# Patient Record
Sex: Female | Born: 1995 | Race: White | Hispanic: No | Marital: Married | Smoking: Never smoker
Health system: Southern US, Community
[De-identification: ages and names within clinical notes are randomized; demographics above are authoritative.]

## PROBLEM LIST (undated history)

## (undated) DIAGNOSIS — Z789 Other specified health status: Secondary | ICD-10-CM

## (undated) DIAGNOSIS — N390 Urinary tract infection, site not specified: Secondary | ICD-10-CM

## (undated) HISTORY — PX: NO PAST SURGERIES: SHX2092

## (undated) HISTORY — DX: Other specified health status: Z78.9

---

## 2019-10-11 NOTE — L&D Delivery Note (Signed)
DELIVERY NOTE  Pt complete and at +2 station with urge to push. Epidural controlling pain. During pushing, deep variable decelerations noted that begant o have slower return to baseline. At this time, station +3. Patient felt to have good chance for vaginal delivery and was consented for operative vaginal delivery via vacuum extractor.  Risks and benefits discussed in detail.  Risks include, but are not limited to the risks of anesthesia, bleeding, infection, damage to maternal tissues, fetal cephalhematoma.  There is also the risk of inability to effect vaginal delivery of the head, or shoulder dystocia that cannot be resolved by established maneuvers, leading to the need for emergency cesarean section.   Midline episiotomy cut. Mity Vac Bell cup applied once, 3 pulls in total. Pt pushed and delivered a viable female infant in LOA position. Anterior and posterior shoulders spontaneously delivered with next two pushes; body easily followed next. Infant placed on mothers abdomen and bulb suction of mouth and nose performed. Cord was then clamped and cut by MD. Cord blood obtained, 3VC. Shorter umbilical cord noted after placenta delivery. Baby had a vigorous spontaneous cry noted. Placenta then delivered at 1517 intact. Fundal massage performed and pitocin per protocol. Fundus firm. The following lacerations were noted: midline episiotomy/shallow 2nd degree. Repaired in routine fashion with 2-0 vicryl Mother and baby stable. Counts correct   Infant time: 1514 Gender: female Placenta time: 1517 Apgars: 9/9 Weight: pending skin-to-skin  Desires circ

## 2019-10-24 LAB — OB RESULTS CONSOLE GC/CHLAMYDIA
Chlamydia: NEGATIVE
Gonorrhea: NEGATIVE

## 2019-10-24 LAB — OB RESULTS CONSOLE HIV ANTIBODY (ROUTINE TESTING): HIV: NONREACTIVE

## 2019-10-24 LAB — OB RESULTS CONSOLE ABO/RH: RH Type: POSITIVE

## 2019-10-24 LAB — OB RESULTS CONSOLE RPR: RPR: NONREACTIVE

## 2019-10-24 LAB — OB RESULTS CONSOLE HEPATITIS B SURFACE ANTIGEN: Hepatitis B Surface Ag: NEGATIVE

## 2019-10-24 LAB — OB RESULTS CONSOLE RUBELLA ANTIBODY, IGM: Rubella: IMMUNE

## 2019-10-24 LAB — OB RESULTS CONSOLE ANTIBODY SCREEN: Antibody Screen: NEGATIVE

## 2020-03-31 LAB — OB RESULTS CONSOLE GBS: GBS: NEGATIVE

## 2020-04-15 ENCOUNTER — Encounter (HOSPITAL_COMMUNITY): Payer: Self-pay | Admitting: *Deleted

## 2020-04-15 ENCOUNTER — Telehealth (HOSPITAL_COMMUNITY): Payer: Self-pay | Admitting: *Deleted

## 2020-04-15 NOTE — Telephone Encounter (Signed)
Preadmission screen  

## 2020-04-23 ENCOUNTER — Other Ambulatory Visit (HOSPITAL_COMMUNITY)
Admission: RE | Admit: 2020-04-23 | Discharge: 2020-04-23 | Disposition: A | Discharge status: Home / Self Care | Payer: BC Managed Care – PPO | Source: Ambulatory Visit | Attending: Obstetrics and Gynecology | Admitting: Obstetrics and Gynecology

## 2020-04-23 DIAGNOSIS — Z01812 Encounter for preprocedural laboratory examination: Secondary | ICD-10-CM | POA: Insufficient documentation

## 2020-04-23 DIAGNOSIS — Z20822 Contact with and (suspected) exposure to covid-19: Secondary | ICD-10-CM | POA: Insufficient documentation

## 2020-04-23 LAB — SARS CORONAVIRUS 2 (TAT 6-24 HRS): SARS Coronavirus 2: NEGATIVE

## 2020-04-24 ENCOUNTER — Other Ambulatory Visit: Payer: Self-pay | Admitting: Obstetrics and Gynecology

## 2020-04-25 ENCOUNTER — Inpatient Hospital Stay (HOSPITAL_COMMUNITY): Payer: BC Managed Care – PPO | Admitting: Anesthesiology

## 2020-04-25 ENCOUNTER — Inpatient Hospital Stay (HOSPITAL_COMMUNITY)
Admission: AD | Admit: 2020-04-25 | Discharge: 2020-04-27 | DRG: 807 | Disposition: A | Discharge status: Home / Self Care | Payer: BC Managed Care – PPO | Attending: Obstetrics and Gynecology | Admitting: Obstetrics and Gynecology

## 2020-04-25 ENCOUNTER — Other Ambulatory Visit: Payer: Self-pay

## 2020-04-25 ENCOUNTER — Inpatient Hospital Stay (HOSPITAL_COMMUNITY): Payer: BC Managed Care – PPO

## 2020-04-25 ENCOUNTER — Encounter (HOSPITAL_COMMUNITY): Payer: Self-pay | Admitting: Obstetrics and Gynecology

## 2020-04-25 DIAGNOSIS — Z3A39 39 weeks gestation of pregnancy: Secondary | ICD-10-CM | POA: Diagnosis not present

## 2020-04-25 DIAGNOSIS — Z20822 Contact with and (suspected) exposure to covid-19: Secondary | ICD-10-CM | POA: Diagnosis present

## 2020-04-25 DIAGNOSIS — O26893 Other specified pregnancy related conditions, third trimester: Secondary | ICD-10-CM | POA: Diagnosis present

## 2020-04-25 DIAGNOSIS — G2581 Restless legs syndrome: Secondary | ICD-10-CM | POA: Diagnosis present

## 2020-04-25 DIAGNOSIS — O99892 Other specified diseases and conditions complicating childbirth: Secondary | ICD-10-CM | POA: Diagnosis present

## 2020-04-25 LAB — CBC
HCT: 39.5 % (ref 36.0–46.0)
Hemoglobin: 13.2 g/dL (ref 12.0–15.0)
MCH: 29.9 pg (ref 26.0–34.0)
MCHC: 33.4 g/dL (ref 30.0–36.0)
MCV: 89.6 fL (ref 80.0–100.0)
Platelets: 176 10*3/uL (ref 150–400)
RBC: 4.41 MIL/uL (ref 3.87–5.11)
RDW: 13.5 % (ref 11.5–15.5)
WBC: 10 10*3/uL (ref 4.0–10.5)
nRBC: 0 % (ref 0.0–0.2)

## 2020-04-25 LAB — TYPE AND SCREEN
ABO/RH(D): B POS
Antibody Screen: NEGATIVE

## 2020-04-25 LAB — RPR: RPR Ser Ql: NONREACTIVE

## 2020-04-25 LAB — ABO/RH: ABO/RH(D): B POS

## 2020-04-25 MED ORDER — LACTATED RINGERS IV SOLN: 500.0000 mL | Freq: Once | INTRAVENOUS | Status: DC

## 2020-04-25 MED ORDER — DIBUCAINE (PERIANAL) 1 % EX OINT: 1.0000 "application " | TOPICAL_OINTMENT | CUTANEOUS | Status: DC | PRN

## 2020-04-25 MED ORDER — COCONUT OIL OIL
1.0000 "application " | TOPICAL_OIL | Status: DC | PRN
  Administered 2020-04-27: 1 via TOPICAL

## 2020-04-25 MED ORDER — EPHEDRINE 5 MG/ML INJ: 10.0000 mg | INTRAVENOUS | Status: DC | PRN

## 2020-04-25 MED ORDER — WITCH HAZEL-GLYCERIN EX PADS: 1.0000 "application " | MEDICATED_PAD | CUTANEOUS | Status: DC | PRN

## 2020-04-25 MED ORDER — OXYTOCIN BOLUS FROM INFUSION
333.0000 mL | Freq: Once | INTRAVENOUS | Status: AC
  Administered 2020-04-25: 333 mL via INTRAVENOUS

## 2020-04-25 MED ORDER — DIPHENHYDRAMINE HCL 25 MG PO CAPS: 25.0000 mg | ORAL_CAPSULE | Freq: Four times a day (QID) | ORAL | Status: DC | PRN

## 2020-04-25 MED ORDER — LACTATED RINGERS IV SOLN
500.0000 mL | Freq: Once | INTRAVENOUS | Status: AC
  Administered 2020-04-25: 500 mL via INTRAVENOUS

## 2020-04-25 MED ORDER — ONDANSETRON HCL 4 MG/2ML IJ SOLN: 4.0000 mg | INTRAMUSCULAR | Status: DC | PRN

## 2020-04-25 MED ORDER — TERBUTALINE SULFATE 1 MG/ML IJ SOLN: 0.2500 mg | Freq: Once | INTRAMUSCULAR | Status: DC | PRN

## 2020-04-25 MED ORDER — LACTATED RINGERS IV SOLN: 500.0000 mL | INTRAVENOUS | Status: DC | PRN

## 2020-04-25 MED ORDER — OXYCODONE-ACETAMINOPHEN 5-325 MG PO TABS: 1.0000 | ORAL_TABLET | ORAL | Status: DC | PRN

## 2020-04-25 MED ORDER — BENZOCAINE-MENTHOL 20-0.5 % EX AERO
1.0000 "application " | INHALATION_SPRAY | CUTANEOUS | Status: DC | PRN
  Administered 2020-04-25: 1 via TOPICAL
  Filled 2020-04-25: qty 56

## 2020-04-25 MED ORDER — FLEET ENEMA 7-19 GM/118ML RE ENEM: 1.0000 | ENEMA | RECTAL | Status: DC | PRN

## 2020-04-25 MED ORDER — OXYCODONE-ACETAMINOPHEN 5-325 MG PO TABS: 2.0000 | ORAL_TABLET | ORAL | Status: DC | PRN

## 2020-04-25 MED ORDER — IBUPROFEN 600 MG PO TABS
600.0000 mg | ORAL_TABLET | Freq: Four times a day (QID) | ORAL | Status: DC
  Administered 2020-04-25 – 2020-04-27 (×8): 600 mg via ORAL
  Filled 2020-04-25 (×8): qty 1

## 2020-04-25 MED ORDER — ONDANSETRON HCL 4 MG PO TABS: 4.0000 mg | ORAL_TABLET | ORAL | Status: DC | PRN

## 2020-04-25 MED ORDER — PHENYLEPHRINE 40 MCG/ML (10ML) SYRINGE FOR IV PUSH (FOR BLOOD PRESSURE SUPPORT)
80.0000 ug | PREFILLED_SYRINGE | INTRAVENOUS | Status: DC | PRN
  Administered 2020-04-25 (×2): 80 ug via INTRAVENOUS

## 2020-04-25 MED ORDER — SIMETHICONE 80 MG PO CHEW: 80.0000 mg | CHEWABLE_TABLET | ORAL | Status: DC | PRN

## 2020-04-25 MED ORDER — OXYTOCIN-SODIUM CHLORIDE 30-0.9 UT/500ML-% IV SOLN: 2.5000 [IU]/h | INTRAVENOUS | Status: DC

## 2020-04-25 MED ORDER — BUTORPHANOL TARTRATE 1 MG/ML IJ SOLN
1.0000 mg | INTRAMUSCULAR | Status: DC | PRN
  Administered 2020-04-25: 1 mg via INTRAVENOUS
  Filled 2020-04-25 (×2): qty 1

## 2020-04-25 MED ORDER — LIDOCAINE HCL (PF) 1 % IJ SOLN
INTRAMUSCULAR | Status: DC | PRN
  Administered 2020-04-25 (×2): 4 mL via EPIDURAL

## 2020-04-25 MED ORDER — SOD CITRATE-CITRIC ACID 500-334 MG/5ML PO SOLN: 30.0000 mL | ORAL | Status: DC | PRN

## 2020-04-25 MED ORDER — TETANUS-DIPHTH-ACELL PERTUSSIS 5-2.5-18.5 LF-MCG/0.5 IM SUSP: 0.5000 mL | Freq: Once | INTRAMUSCULAR | Status: DC

## 2020-04-25 MED ORDER — FENTANYL-BUPIVACAINE-NACL 0.5-0.125-0.9 MG/250ML-% EP SOLN
12.0000 mL/h | EPIDURAL | Status: DC | PRN
  Filled 2020-04-25: qty 250

## 2020-04-25 MED ORDER — OXYTOCIN-SODIUM CHLORIDE 30-0.9 UT/500ML-% IV SOLN
1.0000 m[IU]/min | INTRAVENOUS | Status: DC
  Administered 2020-04-25: 2 m[IU]/min via INTRAVENOUS
  Filled 2020-04-25: qty 500

## 2020-04-25 MED ORDER — LACTATED RINGERS IV SOLN: INTRAVENOUS | Status: DC

## 2020-04-25 MED ORDER — ACETAMINOPHEN 325 MG PO TABS: 650.0000 mg | ORAL_TABLET | ORAL | Status: DC | PRN

## 2020-04-25 MED ORDER — LIDOCAINE HCL (PF) 1 % IJ SOLN: 30.0000 mL | INTRAMUSCULAR | Status: DC | PRN

## 2020-04-25 MED ORDER — SODIUM CHLORIDE (PF) 0.9 % IJ SOLN
INTRAMUSCULAR | Status: DC | PRN
  Administered 2020-04-25: 12 mL/h via EPIDURAL

## 2020-04-25 MED ORDER — DIPHENHYDRAMINE HCL 50 MG/ML IJ SOLN: 12.5000 mg | INTRAMUSCULAR | Status: DC | PRN

## 2020-04-25 MED ORDER — PRENATAL MULTIVITAMIN CH
1.0000 | ORAL_TABLET | Freq: Every day | ORAL | Status: DC
  Administered 2020-04-26 – 2020-04-27 (×2): 1 via ORAL
  Filled 2020-04-25 (×2): qty 1

## 2020-04-25 MED ORDER — SENNOSIDES-DOCUSATE SODIUM 8.6-50 MG PO TABS
2.0000 | ORAL_TABLET | ORAL | Status: DC
  Administered 2020-04-25 – 2020-04-26 (×2): 2 via ORAL
  Filled 2020-04-25 (×2): qty 2

## 2020-04-25 MED ORDER — ZOLPIDEM TARTRATE 5 MG PO TABS: 5.0000 mg | ORAL_TABLET | Freq: Every evening | ORAL | Status: DC | PRN

## 2020-04-25 MED ORDER — ONDANSETRON HCL 4 MG/2ML IJ SOLN: 4.0000 mg | Freq: Four times a day (QID) | INTRAMUSCULAR | Status: DC | PRN

## 2020-04-25 MED ORDER — PHENYLEPHRINE 40 MCG/ML (10ML) SYRINGE FOR IV PUSH (FOR BLOOD PRESSURE SUPPORT)
80.0000 ug | PREFILLED_SYRINGE | INTRAVENOUS | Status: DC | PRN
  Filled 2020-04-25: qty 10

## 2020-04-25 NOTE — Anesthesia Procedure Notes (Signed)
Epidural Patient location during procedure: OB Start time: 04/25/2020 2:12 PM End time: 04/25/2020 2:19 PM  Staffing Anesthesiologist: Mal Amabile, MD Performed: anesthesiologist   Preanesthetic Checklist Completed: patient identified, IV checked, site marked, risks and benefits discussed, surgical consent, monitors and equipment checked, pre-op evaluation and timeout performed  Epidural Patient position: sitting Prep: DuraPrep and site prepped and draped Patient monitoring: continuous pulse ox and blood pressure Approach: midline Location: L3-L4 Injection technique: LOR air  Needle:  Needle type: Tuohy  Needle gauge: 17 G Needle length: 9 cm and 9 Needle insertion depth: 5 cm cm Catheter type: closed end flexible Catheter size: 19 Gauge Catheter at skin depth: 10 cm Test dose: negative and Other  Assessment Events: blood not aspirated, injection not painful, no injection resistance, no paresthesia and negative IV test  Additional Notes Patient identified. Risks and benefits discussed including failed block, incomplete  Pain control, post dural puncture headache, nerve damage, paralysis, blood pressure Changes, nausea, vomiting, reactions to medications-both toxic and allergic and post Partum back pain. All questions were answered. Patient expressed understanding and wished to proceed. Sterile technique was used throughout procedure. Epidural site was Dressed with sterile barrier dressing. No paresthesias, signs of intravascular injection Or signs of intrathecal spread were encountered.  Patient was more comfortable after the epidural was dosed. Please see RN's note for documentation of vital signs and FHR which are stable. Reason for block:procedure for pain

## 2020-04-25 NOTE — Progress Notes (Signed)
Pt s/p epidural, still with some lingering pain on left side. CE now complete/0, no urge to push yet. Will allow epidural to engage fully then being active mgmt

## 2020-04-25 NOTE — H&P (Signed)
Beverly Dean is a 24 y.o. female presenting for scheduled induciton. +FM, denies VB, LOF, CTX.  PNC c/b restless legs syndrome. OB History    Gravida  1   Para      Term      Preterm      AB      Living        SAB      TAB      Ectopic      Multiple      Live Births             Past Medical History:  Diagnosis Date  . Medical history non-contributory    Past Surgical History:  Procedure Laterality Date  . NO PAST SURGERIES     Family History: family history is not on file. Social History:  reports that she has never smoked. She has never used smokeless tobacco. She reports previous alcohol use. She reports that she does not use drugs.     Maternal Diabetes: No 1hr 68 Genetic Screening: Declined Maternal Ultrasounds/Referrals: Normal Fetal Ultrasounds or other Referrals:  None Maternal Substance Abuse:  No Significant Maternal Medications:  None Significant Maternal Lab Results:  Group B Strep negative Other Comments:  None  Review of Systems  Constitutional: Negative for chills and fever.  Respiratory: Negative for shortness of breath.   Cardiovascular: Negative for chest pain, palpitations and leg swelling.  Gastrointestinal: Negative for abdominal pain and vomiting.  Neurological: Negative for dizziness, weakness and headaches.  Psychiatric/Behavioral: Negative for suicidal ideas.   History   There were no vitals taken for this visit. Exam Physical Exam Constitutional:      General: She is not in acute distress.    Appearance: She is well-developed.  HENT:     Head: Normocephalic and atraumatic.  Eyes:     Pupils: Pupils are equal, round, and reactive to light.  Cardiovascular:     Rate and Rhythm: Normal rate and regular rhythm.     Heart sounds: No murmur heard.  No gallop.   Abdominal:     Tenderness: There is no abdominal tenderness. There is no guarding or rebound.  Genitourinary:    Vagina: Normal.  Musculoskeletal:         General: Normal range of motion.     Cervical back: Normal range of motion and neck supple.  Skin:    General: Skin is warm and dry.  Neurological:     Mental Status: She is alert and oriented to person, place, and time.     Prenatal labs: ABO, Rh: B/Positive/-- (01/14 0000) Antibody: Negative (01/14 0000) Rubella: Immune (01/14 0000) RPR: Nonreactive (01/14 0000)  HBsAg: Negative (01/14 0000)  HIV: Non-reactive (01/14 0000)  GBS: Negative/-- (06/22 0000)   Assessment/Plan: This is a 23yo G1P0 @ 39 5/7 by LMP c/w 13 3/7 scan admitted for IOL for favorable cervix at term. PNC c/b restless legs. GBS neg, baby boy, desire circ. AROM, pitocin per protocol, anticipate SVD   Beverly Dean 04/25/2020, 8:40 AM

## 2020-04-25 NOTE — Progress Notes (Signed)
Labor Note  S: s/p stadol x1, feeling increased pressure w/ contraction  O: BP (!) 103/57   Pulse 78   Temp 98 F (36.7 C) (Oral)   Resp 18   Ht 5' 4.5" (1.638 m)   Wt 71.5 kg   BMI 26.65 kg/m  CE: 4/90/0 FHR: Baseline 130, +accels, + early decels, min to modvariability TOCO 4, pitocin at 24mU/min  A/P: This is a 24 y.o. G1P0 at [redacted]w[redacted]d  admitted for IOL. GBS neg, female FWB: cat 1 MWB: s/p stadol x1, deciding on epidural or no Labor course: latent labor but significant descent since admission. Continue to titrate per protocol  Anticipate SVD

## 2020-04-25 NOTE — Progress Notes (Signed)
Cat 1 tracing with baseline 145, + accels, no decels, mod var. Irr ctx on TOCO Clear AROM 0900, CE 3/50/-2. Initiate and titrate pitocin per protocol. VTX on Le[place], EFW 3200g

## 2020-04-25 NOTE — Anesthesia Preprocedure Evaluation (Signed)
Anesthesia Evaluation  Patient identified by MRN, date of birth, ID band Patient awake    Reviewed: Allergy & Precautions, Patient's Chart, lab work & pertinent test results  Airway Mallampati: II  TM Distance: >3 FB Neck ROM: Full    Dental no notable dental hx. (+) Teeth Intact   Pulmonary neg pulmonary ROS,    Pulmonary exam normal breath sounds clear to auscultation       Cardiovascular negative cardio ROS Normal cardiovascular exam Rhythm:Regular Rate:Normal     Neuro/Psych negative neurological ROS  negative psych ROS   GI/Hepatic Neg liver ROS, GERD  ,  Endo/Other  negative endocrine ROS  Renal/GU negative Renal ROS  negative genitourinary   Musculoskeletal negative musculoskeletal ROS (+)   Abdominal   Peds  Hematology negative hematology ROS (+)   Anesthesia Other Findings   Reproductive/Obstetrics (+) Pregnancy                             Anesthesia Physical  Anesthesia Plan  ASA: II  Anesthesia Plan: Epidural   Post-op Pain Management:    Induction:   PONV Risk Score and Plan:   Airway Management Planned: Natural Airway  Additional Equipment:   Intra-op Plan:   Post-operative Plan:   Informed Consent: I have reviewed the patients History and Physical, chart, labs and discussed the procedure including the risks, benefits and alternatives for the proposed anesthesia with the patient or authorized representative who has indicated his/her understanding and acceptance.       Plan Discussed with: Anesthesiologist  Anesthesia Plan Comments:         Anesthesia Quick Evaluation  

## 2020-04-26 LAB — CBC
HCT: 32.8 % — ABNORMAL LOW (ref 36.0–46.0)
Hemoglobin: 10.9 g/dL — ABNORMAL LOW (ref 12.0–15.0)
MCH: 30.4 pg (ref 26.0–34.0)
MCHC: 33.2 g/dL (ref 30.0–36.0)
MCV: 91.6 fL (ref 80.0–100.0)
Platelets: 137 10*3/uL — ABNORMAL LOW (ref 150–400)
RBC: 3.58 MIL/uL — ABNORMAL LOW (ref 3.87–5.11)
RDW: 13.5 % (ref 11.5–15.5)
WBC: 14.4 10*3/uL — ABNORMAL HIGH (ref 4.0–10.5)
nRBC: 0 % (ref 0.0–0.2)

## 2020-04-26 NOTE — Anesthesia Postprocedure Evaluation (Signed)
Anesthesia Post Note  Patient: Beverly Dean  Procedure(s) Performed: AN AD HOC LABOR EPIDURAL     Patient location during evaluation: Mother Baby Anesthesia Type: Epidural Level of consciousness: awake Pain management: satisfactory to patient Vital Signs Assessment: post-procedure vital signs reviewed and stable Respiratory status: spontaneous breathing Cardiovascular status: stable Anesthetic complications: no   No complications documented.  Last Vitals:  Vitals:   04/25/20 2211 04/26/20 0300  BP: 99/65 110/67  Pulse: 91 71  Resp: 16 18  Temp: 36.7 C 36.7 C  SpO2: 100% 100%    Last Pain:  Vitals:   04/26/20 0300  TempSrc: Oral  PainSc:    Pain Goal:                   [employer]

## 2020-04-26 NOTE — Lactation Note (Signed)
This note was copied from a baby's chart. Lactation Consultation Note  Patient Name: Boy Madalynn Pickelsimer VFIEP'P Date: 04/26/2020 Reason for consult: Follow-up assessment;Primapara    Infant is 31 hours old 24 weeker with 2%wt loss. Mom exclusively breastfeeding, noted infant sleeping at the breast. She had some pain with the latch and had some tenderness on both nipples.  Mom has her electric pump but has not used it. She has a manual pump that she used a few times to increase nipple size.   Physical assessment: Outer bruise with redness on both the left and right nipple. LC attempted a few times to latch the baby at the breast but infant sleepy and unlatched easily. Second LC, Kim, tried tea cup hold, but noted the same. We were able to teach Mom hand expression which we were able to see released from both breasts.  LC, Kim, introduced a nipple shield size 20. With shield, infant able to sustain a good latch and audible sounds of milk transfer noted on ausculation. Infant nursed on the left for 20 minutes and on the right for about 5 minutes. Mom is comfortable in cross cradle.   Plan : For Mom, to feed according to cues using the nipple shield and breast compression. Mom knows to feed at least 8 to 12 in 24 hour period. Mom given a size 21 flange for her manual pump which she will used 4 to 6 times a day due to the nipple shield to increase breast stimulation for milk supply.   Milk storage, warm line and breastfeeding support reviewed. Engorgement signs and prevention addressed.     Imoni Kohen  Nicholson-Springer 04/26/2020, 3:29 PM

## 2020-04-26 NOTE — Progress Notes (Addendum)
POSTPARTUM PROGRESS NOTE  Post Partum Day #1  Subjective:  No acute events overnight.  Pt denies problems with ambulating, voiding or po intake.  She denies nausea or vomiting.  Pain is well controlled. Lochia Minimal. Would like discharge today if possible, will have to check baby's feeding status before can OK  Objective: Blood pressure 110/67, pulse 71, temperature 98.1 F (36.7 C), temperature source Oral, resp. rate 18, height 5' 4.5" (1.638 m), weight 71.5 kg, SpO2 100 %, unknown if currently breastfeeding.  Physical Exam:  General: alert, cooperative and no distress Lochia:normal flow Chest: CTAB Heart: RRR no m/r/g Abdomen: +BS, soft, nontender Uterine Fundus: firm, 2cm below umbilicus GU: suture intact, healing well, no purulent drainage Extremities: neg edema, neg calf TTP BL, neg Homans BL  Recent Labs    04/25/20 0848 04/26/20 0455  HGB 13.2 10.9*  HCT 39.5 32.8*    Assessment/Plan:  ASSESSMENT: Lucy Boardman is a 24 y.o. G1P1001 s/p VAVD (FHR decels) @ [redacted]w[redacted]d. PNC c/b restless legs.   Discharge home, Breastfeeding, Lactation consult and Circumcision prior to discharge   LOS: 1 day

## 2020-04-26 NOTE — Lactation Note (Signed)
This note was copied from a baby's chart. Lactation Consultation Note  Patient Name: Beverly Dean BHALP'F Date: 04/26/2020 Reason for consult: Initial assessment;1st time breastfeeding P1, 10 hour term female infant. Per mom, she had DEBP at home. LC changed a void diaper while in the  room, infant had 3 void diapers  since birth. Per mom, infant did not BF in L&D, infant has been latching  in room, mom is working on infant's latch. LC noticed mom is semi short shaft and flat, mom was given a hand pump to pre-pump prior to latching infant at breast. Mom  pre-pumped her breast, then attempt to latch infant on her right breast using the cross cradle hold, infant was reluctant to latch only held breast in mouth at this time. LC discussed hand expression and mom taught back, infant was given 4 mls of colostrum by spoon. Mom will continue to do STS and will continue to latch infant according to cues, 8 to 12+ times within 24 hours and on demand. Mom knows to call RN or LC if she needs further assistance with latching infant at breast. Reviewed Baby & Me book's Breastfeeding Basics.  Mom made aware of O/P services, breastfeeding support groups, community resources, and our phone # for post-discharge questions.      Maternal Data Formula Feeding for Exclusion: No Has patient been taught Hand Expression?: Yes Does the patient have breastfeeding experience prior to this delivery?: No  Feeding Feeding Type: Breast Fed  LATCH Score Latch: Too sleepy or reluctant, no latch achieved, no sucking elicited.  Audible Swallowing: None  Type of Nipple: Flat  Comfort (Breast/Nipple): Soft / non-tender  Hold (Positioning): Assistance needed to correctly position infant at breast and maintain latch.  LATCH Score: 4  Interventions Interventions: Hand express;Breast massage;Support pillows;Position options;Adjust position;Skin to skin;Breast compression;Assisted with latch;Breast feeding basics  reviewed;Pre-pump if needed;Hand pump  Lactation Tools Discussed/Used WIC Program: No Pump Review: Setup, frequency, and cleaning;Milk Storage Initiated by:: Danelle Earthly, IBCLC Date initiated:: 04/26/20   Consult Status Consult Status: Follow-up Date: 04/26/20 Follow-up type: In-patient    Danelle Earthly 04/26/2020, 1:18 AM

## 2020-04-26 NOTE — Progress Notes (Signed)
Baby with latching issues, will postpone circumcision and discharge until tomorrow AM

## 2020-04-27 MED ORDER — IBUPROFEN 600 MG PO TABS: 600.0000 mg | ORAL_TABLET | Freq: Four times a day (QID) | ORAL | 1 refills | Status: DC | PRN

## 2020-04-27 MED ORDER — PRENATAL 27-0.8 MG PO TABS
1.0000 | ORAL_TABLET | Freq: Every day | ORAL | 3 refills | Status: DC
Start: 2020-04-27 — End: 2021-04-12

## 2020-04-27 NOTE — Discharge Summary (Signed)
Postpartum Discharge Summary      Patient Name: Beverly Dean DOB: [birth year] MRN: [id number]  Date of admission: 04/25/2020 Delivery date:04/25/2020  Delivering provider: Deliah Boston  Date of discharge: 04/27/2020  Admitting diagnosis: [redacted] weeks gestation of pregnancy [Z3A.39] Intrauterine pregnancy: [redacted]w[redacted]d    Secondary diagnosis:  Active Problems:   [redacted] weeks gestation of pregnancy  Additional problems: restless legs    Discharge diagnosis: Term Pregnancy Delivered                                              Post partum procedures:N/A Augmentation: AROM and Pitocin Complications: None  Hospital course: Induction of Labor With Vaginal Delivery   24y.o. yo G1P1001 at 317w5das admitted to the hospital 04/25/2020 for induction of labor.  Indication for induction: Favorable cervix at term.  Patient had an uncomplicated labor course as follows: Membrane Rupture Time/Date: 8:58 AM ,04/25/2020   Delivery Method:Vaginal, Vacuum (Extractor)  Episiotomy: Median  Lacerations:  2nd degree  Details of delivery can be found in separate delivery note.  Patient had a routine postpartum course. Patient is discharged home 04/27/20.  Newborn Data: Birth date:04/25/2020  Birth time:3:14 PM  Gender:Female  Living status:Living  Apgars:9 ,9  Weight:3379 g   Magnesium Sulfate received: No BMZ received: No Rhophylac:No MMR:No T-DaP:Given prenatally Flu: N/A Transfusion:No  Physical exam  Vitals:   04/26/20 1100 04/26/20 1422 04/26/20 1959 04/27/20 0552  BP: 99/64 110/64 114/72 110/74  Pulse: 88 87 84 79  Resp: '18 18 16 15  ' Temp: 98.6 F (37 C) 98 F (36.7 C) 98.4 F (36.9 C) 98.2 F (36.8 C)  TempSrc: Oral Oral Oral Oral  SpO2:   99% 100%  Weight:      Height:       General: alert and no distress Lochia: appropriate Uterine Fundus: firm  Labs: Lab Results  Component Value Date   WBC 14.4 (H) 04/26/2020   HGB 10.9 (L) 04/26/2020   HCT 32.8 (L) 04/26/2020    MCV 91.6 04/26/2020   PLT 137 (L) 04/26/2020   No flowsheet data found. Edinburgh Score: Edinburgh Postnatal Depression Scale Screening Tool 04/27/2020  I have been able to laugh and see the funny side of things. 0  I have looked forward with enjoyment to things. 0  I have blamed myself unnecessarily when things went wrong. 1  I have been anxious or worried for no good reason. 0  I have felt scared or panicky for no good reason. 0  Things have been getting on top of me. 0  I have been so unhappy that I have had difficulty sleeping. 0  I have felt sad or miserable. 1  I have been so unhappy that I have been crying. 0  The thought of harming myself has occurred to me. 0  Edinburgh Postnatal Depression Scale Total 2      After visit meds:  Allergies as of 04/27/2020   No Known Allergies     Medication List    TAKE these medications   doxylamine (Sleep) 25 MG tablet Commonly known as: UNISOM Take 25 mg by mouth at bedtime as needed for sleep.   ibuprofen 600 MG tablet Commonly known as: ADVIL Take 1 tablet (600 mg total) by mouth every 6 (six) hours as needed.   multivitamin-prenatal 27-0.8 MG Tabs tablet  Take 1 tablet by mouth daily. What changed: medication strength   omeprazole 20 MG capsule Commonly known as: PRILOSEC Take 20 mg by mouth 2 (two) times daily as needed (indigestion).        Discharge home in stable condition Infant Feeding: Breast Infant Disposition:home with mother Discharge instruction: per After Visit Summary and Postpartum booklet. Activity: Advance as tolerated. Pelvic rest for 6 weeks.  Diet: routine diet Anticipated Birth Control: Unsure Postpartum Appointment:6 weeks Additional Postpartum F/U: N/A Future Appointments:No future appointments. Follow up Visit:  Follow-up Information    Shivaji, Melida Quitter, MD. Schedule an appointment as soon as possible for a visit in 6 week(s).   Specialty: Obstetrics and Gynecology Why: for postpartum  check up (or Dr Melba Coon) Contact information: [place] [address] [phone]                   04/27/2020 Janyth Contes, MD

## 2020-04-27 NOTE — Lactation Note (Signed)
This note was copied from a baby's chart. Lactation Consultation Note  Patient Name: Beverly Dean Beverly Dean Date: 04/27/2020 Reason for consult: Follow-up assessment;Primapara;1st time breastfeeding;Term;Infant weight loss;Other (Comment) (post circ/ mom aware to page with feeding cues and LC will check back - Dolly Rias aware to call [facility])  Baby is 45 hours old - post circ and sleepy. Fed 3 ml of formula at 1020 am.  F/U bilirubin - 9.2.  Spoke with Dr. Ezequiel Essex and Dolly Rias regarding this baby and may go home later after a feeding assessment with the Nipple Shield.  Prior to this meeting LC had spoke with mom and dad and reviewed  The recommended LC plan.  Mom had mentioned the use of the nipple shield is improving and she has noticed milk in the NS after the baby feeds. LC reassured mom that is a good.   LC plan for sore nipples , elevated Bili, and  use of the NS ( #20 was resized and still a good fit and per mom comfortable ) .  Comfort  Gels x 6 days  after feedings alternating with shells while awake  Prior to latch - breast massage, hand express, prepump and apply the NS ) with appetizer of EBM or formula in the top,  Latch with firm support - feed for 15 -20 mins ( 30 mins max ) and then feed 30 ml from a bottle ( EBM or formula )  Post pump both breast for 15 -20 mins / save milk for the next feeding.   Mom aware to call with feeding cues.      Maternal Data    Feeding Feeding Type:  (sound asleep post circ) Nipple Type: Extra Slow Flow  LATCH Score                   Interventions Interventions: Breast feeding basics reviewed  Lactation Tools Discussed/Used Pump Review:  (mom already had the hand punp )   Consult Status Consult Status: Follow-up Date: 04/27/20 Follow-up type: In-patient    Beverly Dean 04/27/2020, 12:44 PM

## 2020-04-27 NOTE — Progress Notes (Addendum)
Post Partum Day 2 Subjective: no complaints, up ad lib, voiding, tolerating PO and nl lochia, pain controlled  Objective: Blood pressure 110/74, pulse 79, temperature 98.2 F (36.8 C), temperature source Oral, resp. rate 15, height 5' 4.5" (1.638 m), weight 71.5 kg, SpO2 100 %, unknown if currently breastfeeding.  Physical Exam:  General: alert and no distress Lochia: appropriate Uterine Fundus: firm   Recent Labs    04/25/20 0848 04/26/20 0455  HGB 13.2 10.9*  HCT 39.5 32.8*    Assessment/Plan: Discharge home, Breastfeeding and Lactation consult.  Routine PP care.  D/C home with Motrin and PNV.  Circumcision prior to d/c   LOS: 2 days   Arrow Tomko Bovard-Stuckert 04/27/2020, 8:22 AM

## 2020-04-27 NOTE — Lactation Note (Signed)
This note was copied from a baby's chart. Lactation Consultation Note  Patient Name: Beverly Dean ETKKO'E Date: 04/27/2020 Reason for consult: Follow-up assessment;Primapara;1st time breastfeeding;Term;Infant weight loss;Other (Comment) (post circ - dad holding baby and asleep/ mom eating her breakfast - will check back)   Maternal Data    Feeding Feeding Type:  (baby sound asleep ) Nipple Type: Extra Slow Flow  LATCH Score                   Interventions Interventions: Breast feeding basics reviewed  Lactation Tools Discussed/Used Pump Review:  (mom already had the hand punp )   Consult Status Consult Status: Follow-up Date: 04/27/20 Follow-up type: In-patient    Beverly Dean 04/27/2020, 12:41 PM

## 2020-04-27 NOTE — Lactation Note (Signed)
This note was copied from a baby's chart. Lactation Consultation Note  Patient Name: Beverly Dean UXNAT'F Date: 04/27/2020 Reason for consult: Follow-up assessment;1st time breastfeeding;Primapara;Infant weight loss;Term  Baby is 31 hours old , Bili at 68 - 9.2  2nd LC visit this afternoon and baby still sluggish and LC showed mom and dad waking technique  LC recommended appetizer of formula and moisten the mouth and baby perked up enough to latch on the right breast football / with #20 NS ( formula instilled onto the top of the NS ) and fed for 10 mins.  LC assisted to switch to the left breast / cross cradle and latched with increased depth and baby was still feeding at 14 mins with increased swallows.  LC reviewed the [facility] plan of care from the consult at 1244.  Both mom and dad have been receptive to teaching and the [facility] plan.  LC recommended F/U with LC in 5-7 days either at her Pedis office , if not available to call [facility] O/P at [facility].  Mom has the [facility] pamphlet with phone numbers.   Maternal Data Has patient been taught Hand Expression?: Yes  Feeding Feeding Type: Breast Fed  LATCH Score Latch: Grasps breast easily, tongue down, lips flanged, rhythmical sucking.  Audible Swallowing: Spontaneous and intermittent  Type of Nipple: Everted at rest and after stimulation  Comfort (Breast/Nipple): Filling, red/small blisters or bruises, mild/mod discomfort  Hold (Positioning): Assistance needed to correctly position infant at breast and maintain latch.  LATCH Score: 8  Interventions Interventions: Breast feeding basics reviewed;Assisted with latch;Skin to skin;Breast massage;Breast compression;Adjust position;Support pillows;Position options;Shells;Coconut oil;Comfort gels;Hand pump  Lactation Tools Discussed/Used Tools: Shells;Pump;Comfort gels;Coconut oil;Flanges Nipple shield size: 20 Flange Size: 24;21 (mom aware to increase flange back to #24 flange if the #21 is to  snug) Shell Type: Inverted Breast pump type: Manual Pump Review: Milk Storage   Consult Status Consult Status: Follow-up (mom plans to check with Pedis office for LC O.P. appt in 5-7 days and if not available will call LC O/P at [facility]) Date: 04/27/20 Follow-up type: Out-patient    Matilde Sprang [place] Beverly Dean 04/27/2020, 3:44 PM
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
## 2020-10-10 NOTE — L&D Delivery Note (Signed)
DELIVERY NOTE  Pt complete and at +2 station with urge to push. Epidural controlling pain. Pt pushed and delivered a viable female infant in compound OP position w/ LLE. Anterior and posterior shoulders spontaneously delivered with next two pushes; body easily followed next. Infant placed on mothers abdomen and bulb suction of mouth and nose performed. Cord was then clamped and cut by RN. Cord blood obtained, 3VC. Baby had a vigorous spontaneous cry noted. Placenta then delivered at 1445 intact. Fundal massage performed and pitocin per protocol. Fundus firm. The following lacerations were noted: 1st deg, rt periurethral oozing. Repaired in routine fashion with 2-0 vicryl Mother and baby stable. Counts correct. EBL 250cc  Infant time: 32 Gender: female, desires circ Placenta time: 1445 Apgars: 9/9 Weight: pending skin-to-skin

## 2020-11-12 LAB — OB RESULTS CONSOLE GBS: GBS: POSITIVE

## 2020-11-12 LAB — OB RESULTS CONSOLE RUBELLA ANTIBODY, IGM: Rubella: IMMUNE

## 2020-11-12 LAB — OB RESULTS CONSOLE GC/CHLAMYDIA
Chlamydia: NEGATIVE
Gonorrhea: NEGATIVE

## 2020-11-12 LAB — OB RESULTS CONSOLE ANTIBODY SCREEN: Antibody Screen: NEGATIVE

## 2020-11-12 LAB — OB RESULTS CONSOLE ABO/RH: RH Type: POSITIVE

## 2020-11-12 LAB — OB RESULTS CONSOLE HIV ANTIBODY (ROUTINE TESTING): HIV: NONREACTIVE

## 2020-11-12 LAB — OB RESULTS CONSOLE RPR: RPR: NONREACTIVE

## 2020-11-12 LAB — OB RESULTS CONSOLE VARICELLA ZOSTER ANTIBODY, IGG: Varicella: NON-IMMUNE/NOT IMMUNE

## 2020-11-12 LAB — OB RESULTS CONSOLE HEPATITIS B SURFACE ANTIGEN: Hepatitis B Surface Ag: NEGATIVE

## 2021-01-31 ENCOUNTER — Emergency Department (HOSPITAL_COMMUNITY)
Admission: EM | Admit: 2021-01-31 | Discharge: 2021-01-31 | Disposition: A | Discharge status: Home / Self Care | Payer: Commercial Managed Care - PPO | Attending: Emergency Medicine | Admitting: Emergency Medicine

## 2021-01-31 ENCOUNTER — Encounter (HOSPITAL_COMMUNITY): Payer: Self-pay | Admitting: Emergency Medicine

## 2021-01-31 DIAGNOSIS — M25512 Pain in left shoulder: Secondary | ICD-10-CM | POA: Diagnosis not present

## 2021-01-31 DIAGNOSIS — S1081XA Abrasion of other specified part of neck, initial encounter: Secondary | ICD-10-CM | POA: Diagnosis not present

## 2021-01-31 DIAGNOSIS — O9A212 Injury, poisoning and certain other consequences of external causes complicating pregnancy, second trimester: Secondary | ICD-10-CM | POA: Diagnosis present

## 2021-01-31 DIAGNOSIS — Y92410 Unspecified street and highway as the place of occurrence of the external cause: Secondary | ICD-10-CM | POA: Diagnosis not present

## 2021-01-31 DIAGNOSIS — Z3A24 24 weeks gestation of pregnancy: Secondary | ICD-10-CM | POA: Insufficient documentation

## 2021-01-31 DIAGNOSIS — R1032 Left lower quadrant pain: Secondary | ICD-10-CM | POA: Insufficient documentation

## 2021-01-31 DIAGNOSIS — Z3A20 20 weeks gestation of pregnancy: Secondary | ICD-10-CM
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
## 2021-04-12 ENCOUNTER — Inpatient Hospital Stay (HOSPITAL_COMMUNITY)
Admission: AD | Admit: 2021-04-12 | Discharge: 2021-04-12 | Disposition: A | Discharge status: Home / Self Care | Payer: Commercial Managed Care - PPO | Attending: Obstetrics and Gynecology | Admitting: Obstetrics and Gynecology

## 2021-04-12 ENCOUNTER — Other Ambulatory Visit: Payer: Self-pay

## 2021-04-12 ENCOUNTER — Encounter (HOSPITAL_COMMUNITY): Payer: Self-pay | Admitting: Obstetrics and Gynecology

## 2021-04-12 DIAGNOSIS — Z3A30 30 weeks gestation of pregnancy: Secondary | ICD-10-CM | POA: Diagnosis not present

## 2021-04-12 DIAGNOSIS — M545 Low back pain, unspecified: Secondary | ICD-10-CM | POA: Insufficient documentation

## 2021-04-12 DIAGNOSIS — R1032 Left lower quadrant pain: Secondary | ICD-10-CM | POA: Insufficient documentation

## 2021-04-12 DIAGNOSIS — Z79899 Other long term (current) drug therapy: Secondary | ICD-10-CM | POA: Insufficient documentation

## 2021-04-12 DIAGNOSIS — E860 Dehydration: Secondary | ICD-10-CM | POA: Diagnosis not present

## 2021-04-12 DIAGNOSIS — O99280 Endocrine, nutritional and metabolic diseases complicating pregnancy, unspecified trimester: Secondary | ICD-10-CM | POA: Diagnosis not present

## 2021-04-12 DIAGNOSIS — O26893 Other specified pregnancy related conditions, third trimester: Secondary | ICD-10-CM | POA: Insufficient documentation

## 2021-04-12 DIAGNOSIS — O99283 Endocrine, nutritional and metabolic diseases complicating pregnancy, third trimester: Secondary | ICD-10-CM

## 2021-04-12 DIAGNOSIS — O99891 Other specified diseases and conditions complicating pregnancy: Secondary | ICD-10-CM

## 2021-04-12 HISTORY — DX: Urinary tract infection, site not specified: N39.0

## 2021-04-12 LAB — URINALYSIS, ROUTINE W REFLEX MICROSCOPIC
Bilirubin Urine: NEGATIVE
Glucose, UA: NEGATIVE mg/dL
Ketones, ur: NEGATIVE mg/dL
Leukocytes,Ua: NEGATIVE
Nitrite: NEGATIVE
Protein, ur: NEGATIVE mg/dL
Specific Gravity, Urine: 1.017 (ref 1.005–1.030)
pH: 7 (ref 5.0–8.0)

## 2021-04-12 LAB — WET PREP, GENITAL
Clue Cells Wet Prep HPF POC: NONE SEEN
Sperm: NONE SEEN
Trich, Wet Prep: NONE SEEN
Yeast Wet Prep HPF POC: NONE SEEN

## 2021-04-12 MED ORDER — LACTATED RINGERS IV BOLUS
1000.0000 mL | Freq: Once | INTRAVENOUS | Status: AC
  Administered 2021-04-12: 1000 mL via INTRAVENOUS

## 2021-04-12 MED ORDER — ACETAMINOPHEN 500 MG PO TABS
1000.0000 mg | ORAL_TABLET | Freq: Once | ORAL | Status: AC
  Administered 2021-04-12: 1000 mg via ORAL
  Filled 2021-04-12: qty 2

## 2021-04-12 MED ORDER — CYCLOBENZAPRINE HCL 10 MG PO TABS: 10.0000 mg | ORAL_TABLET | Freq: Two times a day (BID) | ORAL | 0 refills | Status: DC | PRN

## 2021-04-12 MED ORDER — CYCLOBENZAPRINE HCL 5 MG PO TABS
10.0000 mg | ORAL_TABLET | Freq: Once | ORAL | Status: AC
  Administered 2021-04-12: 10 mg via ORAL
  Filled 2021-04-12: qty 2

## 2021-04-12 MED ORDER — PRENATAL 27-0.8 MG PO TABS: 1.0000 | ORAL_TABLET | Freq: Every day | ORAL | 3 refills | Status: AC

## 2021-04-12 NOTE — MAU Note (Signed)
Pain woke her up.  Left side/flank around to LLQ.  Reports urge to void, but then doesn't really go, no pain with urination. No cva tenderness.
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
## 2021-04-12 NOTE — Discharge Instructions (Signed)

## 2021-04-12 NOTE — MAU Note (Signed)
Presents c/o of left lower abdominal pain, left sided pain and lower back pain.  States hasn't taken any meds for discomfort, used ice and showered, no relief.  Denies VB or LOF.  Endorses +FM.

## 2021-04-13 ENCOUNTER — Other Ambulatory Visit: Payer: Self-pay | Admitting: Advanced Practice Midwife

## 2021-04-13 LAB — CULTURE, OB URINE: Culture: 10000 — AB

## 2021-04-13 MED ORDER — AMOXICILLIN 500 MG PO CAPS: 500.0000 mg | ORAL_CAPSULE | Freq: Three times a day (TID) | ORAL | 2 refills | Status: DC

## 2021-04-13 NOTE — Progress Notes (Signed)
+   GBS on urine culture. Patient notified via phone call. States she was also treated for GBS in first trimester. Confirmed NKDA, pharmacy updated per patient request. Patient denies questions at end of phone call.  Clayton Bibles, MSN, CNM Certified Nurse Midwife, [facility] for [employer], [facility] 04/13/21 9:14 AM

## 2021-04-14 ENCOUNTER — Encounter: Payer: Self-pay | Admitting: Certified Nurse Midwife

## 2021-04-14 DIAGNOSIS — R8271 Bacteriuria: Secondary | ICD-10-CM | POA: Insufficient documentation

## 2021-06-05 ENCOUNTER — Encounter (HOSPITAL_COMMUNITY): Payer: Self-pay | Admitting: Obstetrics and Gynecology

## 2021-06-05 ENCOUNTER — Inpatient Hospital Stay (HOSPITAL_COMMUNITY)
Admission: AD | Admit: 2021-06-05 | Discharge: 2021-06-06 | Disposition: A | Discharge status: Home / Self Care | Payer: Commercial Managed Care - PPO | Attending: Obstetrics and Gynecology | Admitting: Obstetrics and Gynecology

## 2021-06-05 DIAGNOSIS — Z3A38 38 weeks gestation of pregnancy: Secondary | ICD-10-CM

## 2021-06-05 DIAGNOSIS — Z3689 Encounter for other specified antenatal screening: Secondary | ICD-10-CM

## 2021-06-05 DIAGNOSIS — R8271 Bacteriuria: Secondary | ICD-10-CM

## 2021-06-05 DIAGNOSIS — O471 False labor at or after 37 completed weeks of gestation: Secondary | ICD-10-CM

## 2021-06-05 NOTE — MAU Note (Signed)
Pt reports ctx q 2-3 min. Denies any vag bleeding or leaking  good fetal movement felt.

## 2021-06-06 DIAGNOSIS — Z3A38 38 weeks gestation of pregnancy: Secondary | ICD-10-CM | POA: Diagnosis not present

## 2021-06-06 DIAGNOSIS — Z3689 Encounter for other specified antenatal screening: Secondary | ICD-10-CM | POA: Diagnosis not present

## 2021-06-06 DIAGNOSIS — O471 False labor at or after 37 completed weeks of gestation: Secondary | ICD-10-CM

## 2021-06-06 NOTE — MAU Provider Note (Signed)
S: Ms. Beverly Dean is a 25 y.o. G2P1001 at [redacted]w[redacted]d  who presents to MAU today for labor evaluation.   After 1.5 hours nurse reports patient without cervical change.   Cervical exam by RN:  Dilation: 2 Effacement (%): 50 Cervical Position: Posterior Station: -3 Presentation: Undeterminable Exam by:: benji stanley RN  Fetal Monitoring: Baseline: 130 Variability: Moderate Accelerations: Present Decelerations: None Contractions: None graphed  MDM Discussed patient with RN. NST reviewed.   A: SIUP at [redacted]w[redacted]d  False labor Cat I FT NST Reactive  P: Discharge home Labor precautions and kick counts included in AVS Patient to follow-up with primary office as scheduled  Patient may return to MAU as needed or when in labor   Gerrit Heck, [place] 06/06/2021 1:33 AM

## 2021-06-06 NOTE — Progress Notes (Signed)
I have communicated with Gerrit Heck CNM and reviewed vital signs:  Vitals:   06/06/21 0000 06/06/21 0130  BP:  (!) 111/57  Pulse:  (!) 104  Resp:  16  Temp:    SpO2: 100%     Vaginal exam:  Dilation: 2.5 Effacement (%): 50 Cervical Position: Posterior Station: -3 Presentation: Undeterminable Exam by:: Mike Berntsen [email or web address],   Also reviewed contraction pattern and that non-stress test is reactive.  It has been documented that patient is contracting every occas UC and UI minutes with minimal to no cervical change over 2 hours not indicating active labor.  Patient denies any other complaints.  Based on this report provider has given order for discharge.  A discharge order and diagnosis entered by a provider.   Labor discharge instructions reviewed with patient.

## 2021-06-09 ENCOUNTER — Other Ambulatory Visit: Payer: Self-pay | Admitting: Obstetrics and Gynecology

## 2021-06-11 ENCOUNTER — Encounter (HOSPITAL_COMMUNITY): Payer: Self-pay | Admitting: Obstetrics and Gynecology

## 2021-06-11 ENCOUNTER — Inpatient Hospital Stay (HOSPITAL_COMMUNITY)
Admission: AD | Admit: 2021-06-11 | Discharge: 2021-06-13 | DRG: 807 | Disposition: A | Discharge status: Home / Self Care | Payer: Commercial Managed Care - PPO | Attending: Obstetrics and Gynecology | Admitting: Obstetrics and Gynecology

## 2021-06-11 ENCOUNTER — Inpatient Hospital Stay (HOSPITAL_COMMUNITY): Payer: Commercial Managed Care - PPO

## 2021-06-11 ENCOUNTER — Other Ambulatory Visit: Payer: Self-pay

## 2021-06-11 ENCOUNTER — Inpatient Hospital Stay (HOSPITAL_COMMUNITY): Payer: Commercial Managed Care - PPO | Admitting: Anesthesiology

## 2021-06-11 DIAGNOSIS — Z20822 Contact with and (suspected) exposure to covid-19: Secondary | ICD-10-CM | POA: Diagnosis present

## 2021-06-11 DIAGNOSIS — Z8616 Personal history of COVID-19: Secondary | ICD-10-CM | POA: Diagnosis not present

## 2021-06-11 DIAGNOSIS — O26893 Other specified pregnancy related conditions, third trimester: Secondary | ICD-10-CM | POA: Diagnosis present

## 2021-06-11 DIAGNOSIS — O99824 Streptococcus B carrier state complicating childbirth: Principal | ICD-10-CM | POA: Diagnosis present

## 2021-06-11 DIAGNOSIS — Z3A39 39 weeks gestation of pregnancy: Secondary | ICD-10-CM

## 2021-06-11 LAB — RESP PANEL BY RT-PCR (FLU A&B, COVID) ARPGX2
Influenza A by PCR: NEGATIVE
Influenza B by PCR: NEGATIVE
SARS Coronavirus 2 by RT PCR: NEGATIVE

## 2021-06-11 LAB — CBC
HCT: 33 % — ABNORMAL LOW (ref 36.0–46.0)
Hemoglobin: 10.4 g/dL — ABNORMAL LOW (ref 12.0–15.0)
MCH: 25.6 pg — ABNORMAL LOW (ref 26.0–34.0)
MCHC: 31.5 g/dL (ref 30.0–36.0)
MCV: 81.3 fL (ref 80.0–100.0)
Platelets: 204 10*3/uL (ref 150–400)
RBC: 4.06 MIL/uL (ref 3.87–5.11)
RDW: 14.9 % (ref 11.5–15.5)
WBC: 9.5 10*3/uL (ref 4.0–10.5)
nRBC: 0 % (ref 0.0–0.2)

## 2021-06-11 LAB — TYPE AND SCREEN
ABO/RH(D): B POS
Antibody Screen: NEGATIVE

## 2021-06-11 MED ORDER — ONDANSETRON HCL 4 MG PO TABS: 4.0000 mg | ORAL_TABLET | ORAL | Status: DC | PRN

## 2021-06-11 MED ORDER — ACETAMINOPHEN 325 MG PO TABS: 650.0000 mg | ORAL_TABLET | ORAL | Status: DC | PRN

## 2021-06-11 MED ORDER — SOD CITRATE-CITRIC ACID 500-334 MG/5ML PO SOLN: 30.0000 mL | ORAL | Status: DC | PRN

## 2021-06-11 MED ORDER — WITCH HAZEL-GLYCERIN EX PADS: 1.0000 "application " | MEDICATED_PAD | CUTANEOUS | Status: DC | PRN

## 2021-06-11 MED ORDER — DIPHENHYDRAMINE HCL 50 MG/ML IJ SOLN: 12.5000 mg | INTRAMUSCULAR | Status: DC | PRN

## 2021-06-11 MED ORDER — ZOLPIDEM TARTRATE 5 MG PO TABS: 5.0000 mg | ORAL_TABLET | Freq: Every evening | ORAL | Status: DC | PRN

## 2021-06-11 MED ORDER — LIDOCAINE HCL (PF) 1 % IJ SOLN: 30.0000 mL | INTRAMUSCULAR | Status: DC | PRN

## 2021-06-11 MED ORDER — EPHEDRINE 5 MG/ML INJ: 10.0000 mg | INTRAVENOUS | Status: DC | PRN

## 2021-06-11 MED ORDER — PHENYLEPHRINE 40 MCG/ML (10ML) SYRINGE FOR IV PUSH (FOR BLOOD PRESSURE SUPPORT)
80.0000 ug | PREFILLED_SYRINGE | INTRAVENOUS | Status: DC | PRN
  Filled 2021-06-11: qty 10

## 2021-06-11 MED ORDER — SIMETHICONE 80 MG PO CHEW: 80.0000 mg | CHEWABLE_TABLET | ORAL | Status: DC | PRN

## 2021-06-11 MED ORDER — PENICILLIN G POT IN DEXTROSE 60000 UNIT/ML IV SOLN
3.0000 10*6.[IU] | INTRAVENOUS | Status: DC
  Administered 2021-06-11: 3 10*6.[IU] via INTRAVENOUS
  Filled 2021-06-11 (×2): qty 50

## 2021-06-11 MED ORDER — FENTANYL-BUPIVACAINE-NACL 0.5-0.125-0.9 MG/250ML-% EP SOLN
12.0000 mL/h | EPIDURAL | Status: DC | PRN
  Filled 2021-06-11: qty 250

## 2021-06-11 MED ORDER — OXYTOCIN-SODIUM CHLORIDE 30-0.9 UT/500ML-% IV SOLN
1.0000 m[IU]/min | INTRAVENOUS | Status: DC
  Administered 2021-06-11: 2 m[IU]/min via INTRAVENOUS
  Filled 2021-06-11: qty 500

## 2021-06-11 MED ORDER — OXYCODONE-ACETAMINOPHEN 5-325 MG PO TABS: 1.0000 | ORAL_TABLET | ORAL | Status: DC | PRN

## 2021-06-11 MED ORDER — SENNOSIDES-DOCUSATE SODIUM 8.6-50 MG PO TABS
2.0000 | ORAL_TABLET | Freq: Every day | ORAL | Status: DC
  Administered 2021-06-12 – 2021-06-13 (×2): 2 via ORAL
  Filled 2021-06-11 (×2): qty 2

## 2021-06-11 MED ORDER — OXYTOCIN-SODIUM CHLORIDE 30-0.9 UT/500ML-% IV SOLN: 2.5000 [IU]/h | INTRAVENOUS | Status: DC

## 2021-06-11 MED ORDER — OXYTOCIN BOLUS FROM INFUSION
333.0000 mL | Freq: Once | INTRAVENOUS | Status: AC
  Administered 2021-06-11: 333 mL via INTRAVENOUS

## 2021-06-11 MED ORDER — IBUPROFEN 600 MG PO TABS
600.0000 mg | ORAL_TABLET | Freq: Four times a day (QID) | ORAL | Status: DC
  Administered 2021-06-11 – 2021-06-13 (×8): 600 mg via ORAL
  Filled 2021-06-11 (×6): qty 1

## 2021-06-11 MED ORDER — PRENATAL MULTIVITAMIN CH
1.0000 | ORAL_TABLET | Freq: Every day | ORAL | Status: DC
  Administered 2021-06-12 – 2021-06-13 (×2): 1 via ORAL
  Filled 2021-06-11: qty 1

## 2021-06-11 MED ORDER — OXYCODONE-ACETAMINOPHEN 5-325 MG PO TABS
2.0000 | ORAL_TABLET | ORAL | Status: DC | PRN
Start: 2021-06-11 — End: 2021-06-11

## 2021-06-11 MED ORDER — LACTATED RINGERS IV SOLN: 500.0000 mL | INTRAVENOUS | Status: DC | PRN

## 2021-06-11 MED ORDER — EPHEDRINE 5 MG/ML INJ
10.0000 mg | INTRAVENOUS | Status: DC | PRN
Start: 2021-06-11 — End: 2021-06-11

## 2021-06-11 MED ORDER — LACTATED RINGERS IV SOLN: INTRAVENOUS | Status: DC

## 2021-06-11 MED ORDER — DIBUCAINE (PERIANAL) 1 % EX OINT: 1.0000 "application " | TOPICAL_OINTMENT | CUTANEOUS | Status: DC | PRN

## 2021-06-11 MED ORDER — DIPHENHYDRAMINE HCL 25 MG PO CAPS: 25.0000 mg | ORAL_CAPSULE | Freq: Four times a day (QID) | ORAL | Status: DC | PRN

## 2021-06-11 MED ORDER — BUTORPHANOL TARTRATE 1 MG/ML IJ SOLN: 1.0000 mg | INTRAMUSCULAR | Status: DC | PRN

## 2021-06-11 MED ORDER — LACTATED RINGERS IV SOLN: 500.0000 mL | Freq: Once | INTRAVENOUS | Status: DC

## 2021-06-11 MED ORDER — LIDOCAINE HCL (PF) 1 % IJ SOLN
INTRAMUSCULAR | Status: DC | PRN
  Administered 2021-06-11: 5 mL via EPIDURAL

## 2021-06-11 MED ORDER — ONDANSETRON HCL 4 MG/2ML IJ SOLN: 4.0000 mg | Freq: Four times a day (QID) | INTRAMUSCULAR | Status: DC | PRN

## 2021-06-11 MED ORDER — BENZOCAINE-MENTHOL 20-0.5 % EX AERO
1.0000 "application " | INHALATION_SPRAY | CUTANEOUS | Status: DC | PRN
  Administered 2021-06-11: 1 via TOPICAL
  Filled 2021-06-11: qty 56

## 2021-06-11 MED ORDER — COCONUT OIL OIL: 1.0000 "application " | TOPICAL_OIL | Status: DC | PRN

## 2021-06-11 MED ORDER — PHENYLEPHRINE 40 MCG/ML (10ML) SYRINGE FOR IV PUSH (FOR BLOOD PRESSURE SUPPORT)
80.0000 ug | PREFILLED_SYRINGE | INTRAVENOUS | Status: DC | PRN
  Administered 2021-06-11: 80 ug via INTRAVENOUS

## 2021-06-11 MED ORDER — TERBUTALINE SULFATE 1 MG/ML IJ SOLN: 0.2500 mg | Freq: Once | INTRAMUSCULAR | Status: DC | PRN

## 2021-06-11 MED ORDER — SODIUM CHLORIDE 0.9 % IV SOLN
5.0000 10*6.[IU] | Freq: Once | INTRAVENOUS | Status: AC
  Administered 2021-06-11: 5 10*6.[IU] via INTRAVENOUS
  Filled 2021-06-11: qty 5

## 2021-06-11 MED ORDER — ONDANSETRON HCL 4 MG/2ML IJ SOLN: 4.0000 mg | INTRAMUSCULAR | Status: DC | PRN

## 2021-06-11 MED ORDER — TETANUS-DIPHTH-ACELL PERTUSSIS 5-2.5-18.5 LF-MCG/0.5 IM SUSY: 0.5000 mL | PREFILLED_SYRINGE | Freq: Once | INTRAMUSCULAR | Status: DC

## 2021-06-11 NOTE — Anesthesia Procedure Notes (Signed)
Epidural Patient location during procedure: OB Start time: 06/11/2021 12:16 PM  Staffing Anesthesiologist: Trevor Iha, MD Performed: anesthesiologist   Preanesthetic Checklist Completed: patient identified, IV checked, site marked, risks and benefits discussed, surgical consent, monitors and equipment checked, pre-op evaluation and timeout performed  Epidural Patient position: sitting Prep: DuraPrep and site prepped and draped Patient monitoring: continuous pulse ox and blood pressure Approach: midline Location: L3-L4 Injection technique: LOR air  Needle:  Needle type: Tuohy  Needle gauge: 18 G Needle length: 9 cm and 9 Needle insertion depth: 5 cm Catheter type: closed end flexible Catheter size: 19 Gauge Catheter at skin depth: 11 cm Test dose: negative  Assessment Events: blood not aspirated, injection not painful, no injection resistance, no paresthesia and negative IV test  Additional Notes Patient identified. Risks/Benefits/Options discussed with patient including but not limited to bleeding, infection, nerve damage, paralysis, failed block, incomplete pain control, headache, blood pressure changes, nausea, vomiting, reactions to medication both or allergic, itching and postpartum back pain. Confirmed with bedside nurse the patient's most recent platelet count. Confirmed with patient that they are not currently taking any anticoagulation, have any bleeding history or any family history of bleeding disorders. Patient expressed understanding and wished to proceed. All questions were answered. Sterile technique was used throughout the entire procedure. Please see nursing notes for vital signs. Test dose was given through epidural needle and negative prior to continuing to dose epidural or start infusion. Warning signs of high block given to the patient including shortness of breath, tingling/numbness in hands, complete motor block, or any concerning symptoms with instructions  to call for help. Patient was given instructions on fall risk and not to get out of bed. All questions and concerns addressed with instructions to call with any issues.  1 Attempt (S) . Patient tolerated procedure well.

## 2021-06-11 NOTE — Anesthesia Preprocedure Evaluation (Signed)
Anesthesia Evaluation  Patient identified by MRN, date of birth, ID band Patient awake    Reviewed: Allergy & Precautions, NPO status , Patient's Chart, lab work & pertinent test results  Airway Mallampati: II  TM Distance: >3 FB Neck ROM: Full    Dental no notable dental hx. (+) Teeth Intact, Dental Advisory Given   Pulmonary neg pulmonary ROS,    Pulmonary exam normal breath sounds clear to auscultation       Cardiovascular negative cardio ROS Normal cardiovascular exam Rhythm:Regular Rate:Normal     Neuro/Psych negative neurological ROS  negative psych ROS   GI/Hepatic negative GI ROS, Neg liver ROS,   Endo/Other  negative endocrine ROS  Renal/GU negative Renal ROS     Musculoskeletal   Abdominal   Peds  Hematology Lab Results      Component                Value               Date                      WBC                      9.5                 06/11/2021                HGB                      10.4 (L)            06/11/2021                HCT                      33.0 (L)            06/11/2021                MCV                      81.3                06/11/2021                PLT                      204                 06/11/2021              Anesthesia Other Findings   Reproductive/Obstetrics (+) Pregnancy                             Anesthesia Physical Anesthesia Plan  ASA: 2  Anesthesia Plan: Epidural   Post-op Pain Management:    Induction:   PONV Risk Score and Plan:   Airway Management Planned:   Additional Equipment:   Intra-op Plan:   Post-operative Plan:   Informed Consent: I have reviewed the patients History and Physical, chart, labs and discussed the procedure including the risks, benefits and alternatives for the proposed anesthesia with the patient or authorized representative who has indicated his/her understanding and acceptance.       Plan  Discussed with:   Anesthesia Plan Comments: (39.2 Wk G2P1 for  LEA)        Anesthesia Quick Evaluation

## 2021-06-11 NOTE — H&P (Signed)
Beverly Dean is a 25 y.o. female presenting for scheduled IOL. +FM, denies VB, LOF, irr ctx  PNC c/b 1) GBS bacteriuria 2) H/o VAVD for FHR decels  COVID+ in March, [place]. No PCN allergy  OB History     Gravida  2   Para  1   Term  1   Preterm      AB      Living  1      SAB      IAB      Ectopic      Multiple  0   Live Births  1          Past Medical History:  Diagnosis Date   Medical history non-contributory    UTI (urinary tract infection)    reports "a lot of UTI's"   Past Surgical History:  Procedure Laterality Date   NO PAST SURGERIES     Family History: family history includes Asthma in her mother; Healthy in her father. Social History:  reports that she has never smoked. She has never used smokeless tobacco. She reports that she does not currently use alcohol. She reports that she does not use drugs.     Maternal Diabetes: No1hr 23 Genetic Screening: Declined Maternal Ultrasounds/Referrals: Normal Fetal Ultrasounds or other Referrals:  None Maternal Substance Abuse:  No Significant Maternal Medications:  None Significant Maternal Lab Results:  Group B Strep positive Other Comments:  None  Review of Systems  Constitutional:  Negative for chills and fever.  Respiratory:  Negative for shortness of breath.   Cardiovascular:  Negative for chest pain, palpitations and leg swelling.  Gastrointestinal:  Negative for abdominal pain and vomiting.  Neurological:  Negative for dizziness, weakness and headaches.  Psychiatric/Behavioral:  Negative for suicidal ideas.   Maternal Medical History:  Contractions: Onset was 3-5 hours ago.   Frequency: irregular.   Fetal activity: Perceived fetal activity is normal.   Prenatal complications: No bleeding, cholelithiasis or preterm labor.   Prenatal Complications - Diabetes: none.    Blood pressure 113/69, pulse (!) 117, temperature 97.9 F (36.6 C), temperature source Oral, height 5' 4.5" (1.638  m), weight 72.7 kg, unknown if currently breastfeeding. Exam Physical Exam Constitutional:      General: She is not in acute distress.    Appearance: She is well-developed.  HENT:     Head: Normocephalic and atraumatic.  Eyes:     Pupils: Pupils are equal, round, and reactive to light.  Cardiovascular:     Rate and Rhythm: Normal rate and regular rhythm.     Heart sounds: No murmur heard.   No gallop.  Abdominal:     Tenderness: There is no abdominal tenderness. There is no guarding or rebound.  Genitourinary:    Vagina: Normal.  Musculoskeletal:        General: Normal range of motion.     Cervical back: Normal range of motion and neck supple.  Skin:    General: Skin is warm and dry.  Neurological:     Mental Status: She is alert and oriented to person, place, and time.    Prenatal labs: ABO, Rh: --/--/PENDING (09/02 0805) Antibody: PENDING (09/02 0805) Rubella: Immune (02/03 0000) RPR: Nonreactive (02/03 0000)  HBsAg: Negative (02/03 0000)  HIV: Non-reactive (02/03 0000)  GBS: Positive/-- (02/03 0000)   Assessment/Plan: This is a 24yo G2P1001 @ 39 2/7 by TVUS NOT c/w LMP admitted for IOL for favorable cervix at term. GBS bacteriuria. PCN for ppx,  plan for pitocin per protocol, AROM when able. Anticipate SVD.   Cat 1 tracing, TOCO irr   Carlisle Cater 06/11/2021, 8:34 AM

## 2021-06-11 NOTE — Progress Notes (Signed)
S/p epidural, comfortable BP 114/68   Pulse 81   Temp 97.9 F (36.6 C) (Oral)   Ht 5' 4.5" (1.638 m)   Wt 72.7 kg   SpO2 100%   BMI 27.09 kg/m  Clear AROM, ant leip, 0 station TOCO q71min, pitocin at 10 mU/min  Anticipate SVD

## 2021-06-12 LAB — CBC
HCT: 30.1 % — ABNORMAL LOW (ref 36.0–46.0)
Hemoglobin: 9.7 g/dL — ABNORMAL LOW (ref 12.0–15.0)
MCH: 26.4 pg (ref 26.0–34.0)
MCHC: 32.2 g/dL (ref 30.0–36.0)
MCV: 81.8 fL (ref 80.0–100.0)
Platelets: 161 10*3/uL (ref 150–400)
RBC: 3.68 MIL/uL — ABNORMAL LOW (ref 3.87–5.11)
RDW: 14.8 % (ref 11.5–15.5)
WBC: 12.2 10*3/uL — ABNORMAL HIGH (ref 4.0–10.5)
nRBC: 0 % (ref 0.0–0.2)

## 2021-06-12 LAB — RPR: RPR Ser Ql: NONREACTIVE

## 2021-06-12 NOTE — Anesthesia Postprocedure Evaluation (Signed)
Anesthesia Post Note  Patient: Beverly Dean  Procedure(s) Performed: AN AD HOC LABOR EPIDURAL     Patient location during evaluation: Mother Baby Anesthesia Type: Epidural Level of consciousness: awake and alert Pain management: pain level controlled Vital Signs Assessment: post-procedure vital signs reviewed and stable Respiratory status: spontaneous breathing, nonlabored ventilation and respiratory function stable Cardiovascular status: stable Postop Assessment: no headache, no backache and epidural receding Anesthetic complications: no   No notable events documented.  Last Vitals:  Vitals:   06/12/21 0232 06/12/21 0545  BP: 103/72 112/76  Pulse: 78 75  Resp: 16 18  Temp: 36.7 C 36.6 C  SpO2: 100% 100%    Last Pain:  Vitals:   06/12/21 0713  TempSrc:   PainSc: 0-No pain   Pain Goal:                   Trellis Paganini

## 2021-06-12 NOTE — Progress Notes (Signed)
CSW met with MOB to complete consult for history of postpartum depression. CSW observed MOB resting in bed, bonding with infant, and FOB on couch. MOB gave CSW verbal consent to complete consult while FOB was present. CSW explained role, and reason for consult. MOB was pleasant, and polite during engagement with CSW. MOB reported, history of postpartum depression with her previous child.   CSW provided education regarding the baby blues period vs. perinatal mood disorders, discussed treatment and gave resources for mental health follow up if concerns arise. CSW recommends self- evaluation during the postpartum time period using the New Mom Checklist from Postpartum Progress and encouraged MOB to contact a medical professional if symptoms are noted at any time.   MOB reported, since delivery she feels, "good". MOB reported, FOB, and both families are very supportive. MOB denied SI, and HI when CSW assessed for safety.   MOB there are no barriers to follow up infant's care. MOB reported, she has all essentials needed to care for infant. MOB reported, infant has a car seat, and pack n' play. MOB denied any additional barriers.     CSW provided education on sudden infant death syndrome (SIDS).  CSW identifies no further need for intervention or barriers to discharge at this time.  Everson Mott, MSW, LCSW-A Clinical Social Worker- Weekends [phone]  

## 2021-06-12 NOTE — Progress Notes (Signed)
Post Partum Day 1 Subjective: no complaints, up ad lib, voiding, tolerating PO, + flatus, and lochia mild. She denies HA, blurry vision CP. She desires circumcision for baby.   Objective: Blood pressure 112/76, pulse 75, temperature 97.8 F (36.6 C), temperature source Oral, resp. rate 18, height 5' 4.5" (1.638 m), weight 72.7 kg, SpO2 100 %, unknown if currently breastfeeding.  Physical Exam:  General: alert, cooperative, and no distress Lochia: appropriate Uterine Fundus: firm Incision: n/a DVT Evaluation: No evidence of DVT seen on physical exam.  Recent Labs    06/11/21 0811 06/12/21 0439  HGB 10.4* 9.7*  HCT 33.0* 30.1*    Assessment/Plan: Plan for discharge tomorrow and Circumcision prior to discharge Routine pp care    LOS: 1 day   Jaysen Wey W Troy Hartzog 06/12/2021, 3:29 PM

## 2021-06-13 MED ORDER — IBUPROFEN 600 MG PO TABS: 600.0000 mg | ORAL_TABLET | Freq: Four times a day (QID) | ORAL | 1 refills | Status: DC | PRN

## 2021-06-13 NOTE — Discharge Summary (Signed)
Postpartum Discharge Summary  Date of Service updated      Patient Name: Beverly Dean DOB: Mar 18, 1996 MRN: [id number]  Date of admission: 06/11/2021 Delivery date:06/11/2021  Delivering provider: Carlisle Cater  Date of discharge: 06/13/2021  Admitting diagnosis: [redacted] weeks gestation of pregnancy [Z3A.39] Intrauterine pregnancy: [redacted]w[redacted]d     Secondary diagnosis:  Active Problems:   * No active hospital problems. *  Additional problems: none    Discharge diagnosis: Term Pregnancy Delivered                                              Post partum procedures: n/a Augmentation: AROM and Pitocin Complications: None  Hospital course: Induction of Labor With Vaginal Delivery   25 y.o. yo [id number] at [redacted]w[redacted]d was admitted to the hospital 06/11/2021 for induction of labor.  Indication for induction: Favorable cervix at term.  Patient had an uncomplicated labor course as follows: Membrane Rupture Time/Date: 2:07 PM ,06/11/2021   Delivery Method:Vaginal, Spontaneous  Episiotomy: None  Lacerations:  1st degree;Perineal;Periurethral  Details of delivery can be found in separate delivery note.  Patient had a routine postpartum course. Patient is discharged home 06/13/21.  Newborn Data: Birth date:06/11/2021  Birth time:2:43 PM  Gender:Female  Living status:Living  Apgars:8 ,9  Weight:3515 g    Physical exam  Vitals:   06/12/21 0232 06/12/21 0545 06/12/21 2050 06/13/21 0539  BP: 103/72 112/76 121/66 110/74  Pulse: 78 75 88 78  Resp: 16 18 18 18   Temp: 98.1 F (36.7 C) 97.8 F (36.6 C) 98.1 F (36.7 C) 98 F (36.7 C)  TempSrc: Oral Oral Oral Oral  SpO2: 100% 100% 100% 100%  Weight:      Height:       General: alert, cooperative, and no distress Lochia: appropriate Uterine Fundus: firm Incision: N/A DVT Evaluation: No evidence of DVT seen on physical exam. Negative Homan's sign. Labs: Lab Results  Component Value Date   WBC 12.2 (H) 06/12/2021   HGB 9.7 (L) 06/12/2021    HCT 30.1 (L) 06/12/2021   MCV 81.8 06/12/2021   PLT 161 06/12/2021   No flowsheet data found. Edinburgh Score: Edinburgh Postnatal Depression Scale Screening Tool 06/11/2021  I have been able to laugh and see the funny side of things. 0  I have looked forward with enjoyment to things. 0  I have blamed myself unnecessarily when things went wrong. 1  I have been anxious or worried for no good reason. 2  I have felt scared or panicky for no good reason. 1  Things have been getting on top of me. 0  I have been so unhappy that I have had difficulty sleeping. 0  I have felt sad or miserable. 1  I have been so unhappy that I have been crying. 0  The thought of harming myself has occurred to me. 0  Edinburgh Postnatal Depression Scale Total 5      After visit meds:  Allergies as of 06/13/2021   No Known Allergies      Medication List     TAKE these medications    cyclobenzaprine 10 MG tablet Commonly known as: FLEXERIL Take 1 tablet (10 mg total) by mouth 2 (two) times daily as needed for muscle spasms.   famotidine 20 MG tablet Commonly known as: PEPCID Take 20 mg by mouth 2 (two) times  daily.   ibuprofen 600 MG tablet Commonly known as: ADVIL Take 1 tablet (600 mg total) by mouth every 6 (six) hours as needed for moderate pain or cramping.   multivitamin-prenatal 27-0.8 MG Tabs tablet Take 1 tablet by mouth daily.         Discharge home in stable condition Infant Feeding: Breast Infant Disposition:home with mother Discharge instruction: per After Visit Summary and Postpartum booklet. Activity: Advance as tolerated. Pelvic rest for 6 weeks.  Diet: routine diet Anticipated Birth Control: Unsure Postpartum Appointment:6 weeks Additional Postpartum F/U: Postpartum Depression checkup Future Appointments:No future appointments. Follow up Visit:  Follow-up Information     Shivaji, Valerie Roys, MD. Schedule an appointment as soon as possible for a visit in 6 week(s).    Specialty: Obstetrics and Gynecology Why: For postpartum visit Contact information: [address] [place] [address] [place] [address] [phone]                     06/13/2021 Cathrine Muster, DO

## 2021-06-13 NOTE — Progress Notes (Signed)
Post Partum Day 2 Subjective: no complaints, up ad lib, voiding, tolerating PO, + flatus, and lochia mild. She denies any HA, CP or SOB. She is bonding well with baby. Feels ready for discharge to home today  Objective: Blood pressure 110/74, pulse 78, temperature 98 F (36.7 C), temperature source Oral, resp. rate 18, height 5' 4.5" (1.638 m), weight 72.7 kg, SpO2 100 %, unknown if currently breastfeeding.  Physical Exam:  General: alert, cooperative, and no distress Lochia: appropriate Uterine Fundus: firm Incision: n/a DVT Evaluation: No evidence of DVT seen on physical exam. Negative Homan's sign.  Recent Labs    06/11/21 0811 06/12/21 0439  HGB 10.4* 9.7*  HCT 33.0* 30.1*    Assessment/Plan: Discharge home and Breastfeeding Routine pp care in 6 weeks   LOS: 2 days   Beverly Dean Beverly Dean 06/13/2021, 11:05 AM

## 2021-06-13 NOTE — Discharge Instructions (Signed)
Call office with any concerns [phone] 

## 2021-06-25 ENCOUNTER — Telehealth (HOSPITAL_COMMUNITY): Payer: Self-pay | Admitting: *Deleted

## 2021-06-25 NOTE — Telephone Encounter (Signed)
Mom reports feeling great! No concerns about herself. EPDS=0 (hospital score =5) Mom reports baby is fine. Feeding well. Peeing and pooping without difficulty. Sleeps in bassinet on back in mom's room. No concerns about baby.  Duffy Rhody, RN 06-25-2021 at 10:40am

## 2022-03-29 ENCOUNTER — Emergency Department (HOSPITAL_BASED_OUTPATIENT_CLINIC_OR_DEPARTMENT_OTHER): Payer: Self-pay

## 2022-03-29 ENCOUNTER — Encounter (HOSPITAL_BASED_OUTPATIENT_CLINIC_OR_DEPARTMENT_OTHER): Payer: Self-pay | Admitting: Urology

## 2022-03-29 ENCOUNTER — Other Ambulatory Visit: Payer: Self-pay

## 2022-03-29 ENCOUNTER — Emergency Department (HOSPITAL_BASED_OUTPATIENT_CLINIC_OR_DEPARTMENT_OTHER)
Admission: EM | Admit: 2022-03-29 | Discharge: 2022-03-29 | Disposition: A | Discharge status: Home / Self Care | Payer: No Typology Code available for payment source | Attending: Emergency Medicine | Admitting: Emergency Medicine

## 2022-03-29 DIAGNOSIS — Y92481 Parking lot as the place of occurrence of the external cause: Secondary | ICD-10-CM | POA: Diagnosis not present

## 2022-03-29 DIAGNOSIS — M79604 Pain in right leg: Secondary | ICD-10-CM

## 2022-03-29 DIAGNOSIS — M79651 Pain in right thigh: Secondary | ICD-10-CM | POA: Insufficient documentation

## 2022-03-29 DIAGNOSIS — Y990 Civilian activity done for income or pay: Secondary | ICD-10-CM | POA: Diagnosis not present

## 2022-03-29 LAB — PREGNANCY, URINE: Preg Test, Ur: NEGATIVE

## 2022-03-29 NOTE — Discharge Instructions (Signed)
This, ice, Motrin or Tylenol for pain
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
## 2022-03-29 NOTE — ED Notes (Signed)
Pt ambulatory without difficulty. 
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
## 2022-11-10 ENCOUNTER — Emergency Department (HOSPITAL_BASED_OUTPATIENT_CLINIC_OR_DEPARTMENT_OTHER): Payer: Managed Care, Other (non HMO)

## 2022-11-10 ENCOUNTER — Emergency Department (HOSPITAL_BASED_OUTPATIENT_CLINIC_OR_DEPARTMENT_OTHER)
Admission: EM | Admit: 2022-11-10 | Discharge: 2022-11-10 | Disposition: A | Discharge status: Home / Self Care | Payer: Managed Care, Other (non HMO) | Attending: Emergency Medicine | Admitting: Emergency Medicine

## 2022-11-10 ENCOUNTER — Other Ambulatory Visit: Payer: Self-pay

## 2022-11-10 DIAGNOSIS — R200 Anesthesia of skin: Secondary | ICD-10-CM | POA: Insufficient documentation

## 2022-11-10 DIAGNOSIS — R002 Palpitations: Secondary | ICD-10-CM | POA: Insufficient documentation

## 2022-11-10 DIAGNOSIS — R799 Abnormal finding of blood chemistry, unspecified: Secondary | ICD-10-CM | POA: Diagnosis not present

## 2022-11-10 DIAGNOSIS — R079 Chest pain, unspecified: Secondary | ICD-10-CM | POA: Diagnosis present

## 2022-11-10 DIAGNOSIS — R944 Abnormal results of kidney function studies: Secondary | ICD-10-CM | POA: Insufficient documentation

## 2022-11-10 DIAGNOSIS — R42 Dizziness and giddiness: Secondary | ICD-10-CM | POA: Insufficient documentation

## 2022-11-10 LAB — PREGNANCY, URINE: Preg Test, Ur: NEGATIVE

## 2022-11-10 LAB — BASIC METABOLIC PANEL
Anion gap: 8 (ref 5–15)
BUN: 29 mg/dL — ABNORMAL HIGH (ref 6–20)
CO2: 27 mmol/L (ref 22–32)
Calcium: 8.6 mg/dL — ABNORMAL LOW (ref 8.9–10.3)
Chloride: 99 mmol/L (ref 98–111)
Creatinine, Ser: 1.36 mg/dL — ABNORMAL HIGH (ref 0.44–1.00)
GFR, Estimated: 55 mL/min — ABNORMAL LOW (ref >60)
Glucose, Bld: 99 mg/dL (ref 70–99)
Potassium: 4 mmol/L (ref 3.5–5.1)
Sodium: 134 mmol/L — ABNORMAL LOW (ref 135–145)

## 2022-11-10 LAB — CBC
HCT: 39.4 % (ref 36.0–46.0)
Hemoglobin: 13 g/dL (ref 12.0–15.0)
MCH: 28.3 pg (ref 26.0–34.0)
MCHC: 33 g/dL (ref 30.0–36.0)
MCV: 85.8 fL (ref 80.0–100.0)
Platelets: 226 10*3/uL (ref 150–400)
RBC: 4.59 MIL/uL (ref 3.87–5.11)
RDW: 12.6 % (ref 11.5–15.5)
WBC: 9.8 10*3/uL (ref 4.0–10.5)
nRBC: 0 % (ref 0.0–0.2)

## 2022-11-10 LAB — TROPONIN I (HIGH SENSITIVITY): Troponin I (High Sensitivity): <2 ng/L (ref <18)

## 2022-11-10 NOTE — ED Triage Notes (Signed)
Pt states about an hour ago she felt weak and then began having left-sided "squeezing" chest pain and numbness in her left arm.  Pt states this has happened before.  States she was told years ago she had an irregular heart beat.  Mild nausea. No vomiting or diarrhea.

## 2022-11-10 NOTE — ED Notes (Signed)
Pt ambulatory to restroom with independent steady gait °

## 2022-11-10 NOTE — Discharge Instructions (Signed)
You were seen in the emergency department today for chest pain.  Your workup here has been reassuring.  You will need to have your kidney function rechecked in 1 week.  You may go to a primary care, urgent care.  If you do not have 1 I have given you information for community health and wellness.  Please drink plenty of fluids in the meantime.  Additionally please establish with a primary care so that you can have appropriate follow-up on the symptoms.  Please return to the emergency department if you have worsening symptoms.

## 2022-11-10 NOTE — ED Provider Notes (Signed)
[facility] Provider Note   CSN: [id number] Arrival date & time: 11/10/22  1305     History  Chief Complaint  Patient presents with   Chest Pain    Beverly Dean is a 27 y.o. female.  With past medical history noncontributory who presents to the emergency department with chest pain.  States that today she was at work when she began having intermittent, left sided, squeezing chest pain. States that afterward she began feeling like her left arm was heavy. Had associated palpitations, lightheadedness. She states that the episode lasted maybe around 15 minutes and then abated. Currently asymptomatic. Denies having shortness of breath, syncope. She has no medical history, takes no medications, no alcohol or drug use, no history of congenital heart issues or family history of sudden death. No recent travel, immobilization, malignancy, estrogen or tobacco use, hx dvt or pe.    Chest Pain Associated symptoms: palpitations        Home Medications Prior to Admission medications   Medication Sig Start Date End Date Taking? Authorizing Provider  cyclobenzaprine (FLEXERIL) 10 MG tablet Take 1 tablet (10 mg total) by mouth 2 (two) times daily as needed for muscle spasms. 04/12/21   Darlina Rumpf, CNM  famotidine (PEPCID) 20 MG tablet Take 20 mg by mouth 2 (two) times daily.    [provider]  ibuprofen (ADVIL) 600 MG tablet Take 1 tablet (600 mg total) by mouth every 6 (six) hours as needed for moderate pain or cramping. 06/13/21   Banga, Bonnee Quin, DO  Prenatal Vit-Fe Fumarate-FA (MULTIVITAMIN-PRENATAL) 27-0.8 MG TABS tablet Take 1 tablet by mouth daily. 04/12/21   Darlina Rumpf, CNM      Allergies    Patient has no known allergies.    Review of Systems   Review of Systems  Cardiovascular:  Positive for chest pain and palpitations.  Neurological:  Positive for light-headedness.  All other systems reviewed and are  negative.   Physical Exam Updated Vital Signs BP 121/63   Pulse 77   Temp 97.6 F (36.4 C) (Oral)   Resp (!) 23   SpO2 98%  Physical Exam Vitals and nursing note reviewed.  Constitutional:      General: She is not in acute distress.    Appearance: Normal appearance. She is well-developed. She is not ill-appearing or toxic-appearing.  HENT:     Head: Normocephalic and atraumatic.  Eyes:     General: No scleral icterus.    Pupils: Pupils are equal, round, and reactive to light.  Neck:     Vascular: No JVD.  Cardiovascular:     Rate and Rhythm: Normal rate and regular rhythm.     Pulses:          Radial pulses are 2+ on the right side and 2+ on the left side.     Heart sounds: Normal heart sounds. No murmur heard. Pulmonary:     Effort: Pulmonary effort is normal. No tachypnea or respiratory distress.     Breath sounds: Normal breath sounds.  Chest:     Chest wall: No tenderness.  Abdominal:     General: Bowel sounds are normal.     Palpations: Abdomen is soft.  Musculoskeletal:     Cervical back: Normal range of motion.     Right lower leg: No edema.     Left lower leg: No edema.  Skin:    General: Skin is warm and dry.  Capillary Refill: Capillary refill takes less than 2 seconds.  Neurological:     General: No focal deficit present.     Mental Status: She is alert and oriented to person, place, and time.  Psychiatric:        Mood and Affect: Mood normal.        Behavior: Behavior normal.     ED Results / Procedures / Treatments   Labs (all labs ordered are listed, but only abnormal results are displayed) Labs Reviewed  BASIC METABOLIC PANEL - Abnormal; Notable for the following components:      Result Value   Sodium 134 (*)    BUN 29 (*)    Creatinine, Ser 1.36 (*)    Calcium 8.6 (*)    GFR, Estimated 55 (*)    All other components within normal limits  CBC  PREGNANCY, URINE  TROPONIN I (HIGH SENSITIVITY)    EKG EKG  Interpretation  Date/Time:  Thursday November 10 2022 13:26:00 EST Ventricular Rate:  70 PR Interval:  129 QRS Duration: 83 QT Interval:  373 QTC Calculation: 403 R Axis:   83 Text Interpretation: Sinus rhythm Confirmed by Cindee Lame [phone]) on 11/10/2022 3:46:37 PM  Radiology DG Chest 2 View  Result Date: 11/10/2022 CLINICAL DATA:  CP EXAM: CHEST - 2 VIEW COMPARISON:  None Available. FINDINGS: Cardiac silhouette is unremarkable. No pneumothorax or pleural effusion. The lungs are clear. The visualized skeletal structures are unremarkable. IMPRESSION: No acute cardiopulmonary process. Electronically Signed   By: Sammie Bench M.D.   On: 11/10/2022 13:47    Procedures Procedures   Medications Ordered in ED Medications - No data to display  ED Course/ Medical Decision Making/ A&P   {          HEART Score: 1                Medical Decision Making Amount and/or Complexity of Data Reviewed Labs: ordered. Radiology: ordered.  Initial Impression and Ddx 28 year old female who presents to the emergency department with chest pain. Patient PMH that increases complexity of ED encounter: None Differential: Acute chest syndrome, stable angina, atypical angina, pulmonary embolism, pneumothorax, aortic dissection, pleural effusion, CHF, COPD, asthma, myocarditis, pericarditis, cardiac tamponade, chest wall pain   Interpretation of Diagnostics I independent reviewed and interpreted the labs as followed: CBC within normal limits, BMP with elevated BUN and creatinine.  No previous to compare.  Troponin x 1 is negative.  - I independently visualized the following imaging with scope of interpretation limited to determining acute life threatening conditions related to emergency care: CXR, which revealed no acute findings   Patient Reassessment and Ultimate Disposition/Management 27 year old female who presents to the emergency department with an episode of chest pain earlier today.  Currently  asymptomatic. EKG without ischemia or infarction, troponin x1 negative, doubt ACS.  Would not obtain delta troponin as her symptoms were earlier today, currently asymptomatic, lack of risk factors. Does not appear fluid volume overloaded on exam, no edema on CXR, doubt CHF \ Considered but doubt PE. PERC 0, Wells low risk so will defer d-dimer or CTA PE study at this time. CXR without evidence of pneumonia, pleural effusion or pneumothorax. No recent illnesses and troponin negative so doubt pericarditis or myocarditis  Symptoms inconsistent with aortic dissection  HEART Score: 1    Her symptoms of left-sided squeezing pain and left arm numbness are somewhat concerning for cardiac in origin and I cannot reproduce her chest pain.  Will have her  follow-up with her primary care provider to establish care and ongoing routine follow-up.  At some point she may need a Holter monitor which could be referred by primary care.  Additionally her creatinine here was 1.36.  I do not have a previous to compare.  We did orthostatics and does not appear to be dehydrated, euvolemic clinically on exam.  She will need to follow-up in a week to have her creatinine rechecked.  She verbalized understanding.  Discussed this case with attending, Dr. Rogene Houston who agrees with the plan.  The patient has been appropriately medically screened and/or stabilized in the ED. I have low suspicion for any other emergent medical condition which would require further screening, evaluation or treatment in the ED or require inpatient management. At time of discharge the patient is hemodynamically stable and in no acute distress. I have discussed work-up results and diagnosis with patient and answered all questions. Patient is agreeable with discharge plan. We discussed strict return precautions for returning to the emergency department and they verbalized understanding.     Patient management required discussion with the following services  or consulting groups:  None  Complexity of Problems Addressed Acute complicated illness or Injury  Additional Data Reviewed and Analyzed Further history obtained from: Further history from spouse/family member, Past medical history and medications listed in the EMR, and Care Everywhere  Patient Encounter Risk Assessment None  Final Clinical Impression(s) / ED Diagnoses Final diagnoses:  Nonspecific chest pain    Rx / DC Orders ED Discharge Orders     None         Mickie Hillier, PA-C 11/10/22 1934    Fredia Sorrow, MD 11/12/22 2006

## 2022-11-10 NOTE — ED Notes (Signed)
Pt A&OX4 ambulatory at d/c with independent steady gait. Pt verbalized understanding of d/c instructions and follow up care. 

## 2023-07-10 LAB — HEPATITIS C ANTIBODY: HCV Ab: NEGATIVE

## 2023-07-10 LAB — OB RESULTS CONSOLE HIV ANTIBODY (ROUTINE TESTING): HIV: NONREACTIVE

## 2023-07-10 LAB — OB RESULTS CONSOLE ANTIBODY SCREEN: Antibody Screen: NEGATIVE

## 2023-07-10 LAB — OB RESULTS CONSOLE GC/CHLAMYDIA
Chlamydia: NEGATIVE
Neisseria Gonorrhea: NEGATIVE

## 2023-07-10 LAB — OB RESULTS CONSOLE HEPATITIS B SURFACE ANTIGEN: Hepatitis B Surface Ag: NEGATIVE

## 2023-07-10 LAB — OB RESULTS CONSOLE RUBELLA ANTIBODY, IGM: Rubella: IMMUNE

## 2023-07-10 LAB — OB RESULTS CONSOLE RPR: RPR: NONREACTIVE

## 2023-10-11 NOTE — L&D Delivery Note (Signed)
 DELIVERY NOTE  Pt complete and at +2 station with urge to push. Epidural controlling pain. Pt pushed and delivered a viable female infant in LOA position. Anterior and posterior shoulders spontaneously delivered with next two pushes; body easily followed next. Infant placed on mothers abdomen and bulb suction of mouth and nose performed. Cord was then clamped and cut by FOB. Cord blood obtained, 3VC. Baby had a vigorous spontaneous cry noted. Placenta then delivered at 2052 intact. Fundal massage performed and pitocin  per protocol. Fundus firm. The following lacerations were noted: NONE, EBL 100cc, QBL pending. Mother and baby stable. Counts correct  Infant time: 2049 Gender: female, DESIRES CIRC Placenta time: 2052 Apgars: 9/9 Weight: pending skin-to-skin

## 2024-01-10 LAB — OB RESULTS CONSOLE GBS: GBS: NEGATIVE

## 2024-01-22 ENCOUNTER — Telehealth (HOSPITAL_COMMUNITY): Payer: Self-pay | Admitting: *Deleted

## 2024-01-22 ENCOUNTER — Encounter (HOSPITAL_COMMUNITY): Payer: Self-pay | Admitting: *Deleted

## 2024-01-22 NOTE — Telephone Encounter (Signed)
 Preadmission screen

## 2024-01-25 ENCOUNTER — Encounter (HOSPITAL_COMMUNITY): Payer: Self-pay | Admitting: *Deleted

## 2024-01-31 ENCOUNTER — Other Ambulatory Visit: Payer: Self-pay | Admitting: Obstetrics and Gynecology

## 2024-01-31 DIAGNOSIS — Z3A39 39 weeks gestation of pregnancy: Secondary | ICD-10-CM

## 2024-02-04 ENCOUNTER — Inpatient Hospital Stay (HOSPITAL_COMMUNITY): Admitting: Anesthesiology

## 2024-02-04 ENCOUNTER — Inpatient Hospital Stay (HOSPITAL_COMMUNITY)
Admission: RE | Admit: 2024-02-04 | Discharge: 2024-02-06 | DRG: 807 | Disposition: A | Discharge status: Home / Self Care | Attending: Obstetrics and Gynecology | Admitting: Obstetrics and Gynecology

## 2024-02-04 ENCOUNTER — Encounter (HOSPITAL_COMMUNITY): Payer: Self-pay | Admitting: Obstetrics and Gynecology

## 2024-02-04 ENCOUNTER — Other Ambulatory Visit: Payer: Self-pay

## 2024-02-04 DIAGNOSIS — K219 Gastro-esophageal reflux disease without esophagitis: Secondary | ICD-10-CM | POA: Diagnosis present

## 2024-02-04 DIAGNOSIS — Z3A39 39 weeks gestation of pregnancy: Principal | ICD-10-CM

## 2024-02-04 DIAGNOSIS — O9962 Diseases of the digestive system complicating childbirth: Secondary | ICD-10-CM | POA: Diagnosis present

## 2024-02-04 DIAGNOSIS — O9902 Anemia complicating childbirth: Secondary | ICD-10-CM | POA: Diagnosis present

## 2024-02-04 DIAGNOSIS — O26893 Other specified pregnancy related conditions, third trimester: Secondary | ICD-10-CM | POA: Diagnosis present

## 2024-02-04 LAB — CBC
HCT: 34.5 % — ABNORMAL LOW (ref 36.0–46.0)
Hemoglobin: 10.9 g/dL — ABNORMAL LOW (ref 12.0–15.0)
MCH: 26.5 pg (ref 26.0–34.0)
MCHC: 31.6 g/dL (ref 30.0–36.0)
MCV: 83.9 fL (ref 80.0–100.0)
Platelets: 197 10*3/uL (ref 150–400)
RBC: 4.11 MIL/uL (ref 3.87–5.11)
RDW: 13 % (ref 11.5–15.5)
WBC: 9.9 10*3/uL (ref 4.0–10.5)
nRBC: 0 % (ref 0.0–0.2)

## 2024-02-04 LAB — TYPE AND SCREEN
ABO/RH(D): B POS
Antibody Screen: NEGATIVE

## 2024-02-04 MED ORDER — WITCH HAZEL-GLYCERIN EX PADS: 1.0000 | MEDICATED_PAD | CUTANEOUS | Status: DC | PRN

## 2024-02-04 MED ORDER — OXYCODONE-ACETAMINOPHEN 5-325 MG PO TABS: 2.0000 | ORAL_TABLET | ORAL | Status: DC | PRN

## 2024-02-04 MED ORDER — IBUPROFEN 600 MG PO TABS
600.0000 mg | ORAL_TABLET | Freq: Four times a day (QID) | ORAL | Status: DC
  Administered 2024-02-04 – 2024-02-06 (×7): 600 mg via ORAL
  Filled 2024-02-04 (×7): qty 1

## 2024-02-04 MED ORDER — SIMETHICONE 80 MG PO CHEW: 80.0000 mg | CHEWABLE_TABLET | ORAL | Status: DC | PRN

## 2024-02-04 MED ORDER — DIPHENHYDRAMINE HCL 50 MG/ML IJ SOLN: 12.5000 mg | INTRAMUSCULAR | Status: DC | PRN

## 2024-02-04 MED ORDER — EPHEDRINE 5 MG/ML INJ: 10.0000 mg | INTRAVENOUS | Status: DC | PRN

## 2024-02-04 MED ORDER — COCONUT OIL OIL: 1.0000 | TOPICAL_OIL | Status: DC | PRN

## 2024-02-04 MED ORDER — OXYCODONE-ACETAMINOPHEN 5-325 MG PO TABS: 1.0000 | ORAL_TABLET | ORAL | Status: DC | PRN

## 2024-02-04 MED ORDER — OXYTOCIN-SODIUM CHLORIDE 30-0.9 UT/500ML-% IV SOLN
1.0000 m[IU]/min | INTRAVENOUS | Status: DC
  Administered 2024-02-04: 2 m[IU]/min via INTRAVENOUS
  Filled 2024-02-04: qty 500

## 2024-02-04 MED ORDER — TERBUTALINE SULFATE 1 MG/ML IJ SOLN: 0.2500 mg | Freq: Once | INTRAMUSCULAR | Status: DC | PRN

## 2024-02-04 MED ORDER — ACETAMINOPHEN 325 MG PO TABS
650.0000 mg | ORAL_TABLET | ORAL | Status: DC | PRN
  Administered 2024-02-05 (×2): 650 mg via ORAL
  Filled 2024-02-04 (×2): qty 2

## 2024-02-04 MED ORDER — OXYTOCIN BOLUS FROM INFUSION
333.0000 mL | Freq: Once | INTRAVENOUS | Status: AC
  Administered 2024-02-04: 333 mL via INTRAVENOUS

## 2024-02-04 MED ORDER — LACTATED RINGERS IV SOLN
500.0000 mL | Freq: Once | INTRAVENOUS | Status: AC
  Administered 2024-02-04: 500 mL via INTRAVENOUS

## 2024-02-04 MED ORDER — FENTANYL-BUPIVACAINE-NACL 0.5-0.125-0.9 MG/250ML-% EP SOLN
12.0000 mL/h | EPIDURAL | Status: DC | PRN
  Administered 2024-02-04: 12 mL/h via EPIDURAL
  Filled 2024-02-04: qty 250

## 2024-02-04 MED ORDER — ACETAMINOPHEN 325 MG PO TABS: 650.0000 mg | ORAL_TABLET | ORAL | Status: DC | PRN

## 2024-02-04 MED ORDER — PRENATAL MULTIVITAMIN CH
1.0000 | ORAL_TABLET | Freq: Every day | ORAL | Status: DC
  Administered 2024-02-05 – 2024-02-06 (×2): 1 via ORAL
  Filled 2024-02-04 (×2): qty 1

## 2024-02-04 MED ORDER — PHENYLEPHRINE 80 MCG/ML (10ML) SYRINGE FOR IV PUSH (FOR BLOOD PRESSURE SUPPORT): 80.0000 ug | PREFILLED_SYRINGE | INTRAVENOUS | Status: DC | PRN

## 2024-02-04 MED ORDER — OXYTOCIN-SODIUM CHLORIDE 30-0.9 UT/500ML-% IV SOLN: 2.5000 [IU]/h | INTRAVENOUS | Status: DC

## 2024-02-04 MED ORDER — FENTANYL CITRATE (PF) 100 MCG/2ML IJ SOLN: 50.0000 ug | INTRAMUSCULAR | Status: DC | PRN

## 2024-02-04 MED ORDER — DIBUCAINE (PERIANAL) 1 % EX OINT: 1.0000 | TOPICAL_OINTMENT | CUTANEOUS | Status: DC | PRN

## 2024-02-04 MED ORDER — ONDANSETRON HCL 4 MG/2ML IJ SOLN
4.0000 mg | Freq: Four times a day (QID) | INTRAMUSCULAR | Status: DC | PRN
Start: 2024-02-04 — End: 2024-02-04

## 2024-02-04 MED ORDER — LIDOCAINE HCL (PF) 1 % IJ SOLN: 30.0000 mL | INTRAMUSCULAR | Status: DC | PRN

## 2024-02-04 MED ORDER — ONDANSETRON HCL 4 MG/2ML IJ SOLN: 4.0000 mg | INTRAMUSCULAR | Status: DC | PRN

## 2024-02-04 MED ORDER — LACTATED RINGERS IV SOLN: INTRAVENOUS | Status: DC

## 2024-02-04 MED ORDER — TETANUS-DIPHTH-ACELL PERTUSSIS 5-2.5-18.5 LF-MCG/0.5 IM SUSY: 0.5000 mL | PREFILLED_SYRINGE | Freq: Once | INTRAMUSCULAR | Status: DC

## 2024-02-04 MED ORDER — SOD CITRATE-CITRIC ACID 500-334 MG/5ML PO SOLN: 30.0000 mL | ORAL | Status: DC | PRN

## 2024-02-04 MED ORDER — BENZOCAINE-MENTHOL 20-0.5 % EX AERO
1.0000 | INHALATION_SPRAY | CUTANEOUS | Status: DC | PRN
  Administered 2024-02-04: 1 via TOPICAL
  Filled 2024-02-04: qty 56

## 2024-02-04 MED ORDER — LACTATED RINGERS IV SOLN: 500.0000 mL | INTRAVENOUS | Status: DC | PRN

## 2024-02-04 MED ORDER — DIPHENHYDRAMINE HCL 25 MG PO CAPS: 25.0000 mg | ORAL_CAPSULE | Freq: Four times a day (QID) | ORAL | Status: DC | PRN

## 2024-02-04 MED ORDER — LIDOCAINE HCL (PF) 1 % IJ SOLN
INTRAMUSCULAR | Status: DC | PRN
  Administered 2024-02-04: 2 mL via EPIDURAL
  Administered 2024-02-04: 5 mL via EPIDURAL
  Administered 2024-02-04: 3 mL via EPIDURAL

## 2024-02-04 MED ORDER — ZOLPIDEM TARTRATE 5 MG PO TABS: 5.0000 mg | ORAL_TABLET | Freq: Every evening | ORAL | Status: DC | PRN

## 2024-02-04 MED ORDER — SENNOSIDES-DOCUSATE SODIUM 8.6-50 MG PO TABS
2.0000 | ORAL_TABLET | Freq: Every day | ORAL | Status: DC
  Administered 2024-02-05 – 2024-02-06 (×2): 2 via ORAL
  Filled 2024-02-04 (×2): qty 2

## 2024-02-04 MED ORDER — LACTATED RINGERS IV SOLN: 500.0000 mL | Freq: Once | INTRAVENOUS | Status: DC

## 2024-02-04 MED ORDER — ONDANSETRON HCL 4 MG PO TABS: 4.0000 mg | ORAL_TABLET | ORAL | Status: DC | PRN

## 2024-02-04 NOTE — H&P (Signed)
 Beverly Dean is a 28 y.o. female presenting for scheduled IOL. Delayed admission 2/2 busy floor. +FM< denies VB, LOF, hving some increased pelvic pressure.  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 Social History:  reports that she has never smoked. She has never used smokeless tobacco. She reports that she does not currently use alcohol. She reports that she does not use drugs.     Maternal Diabetes: No1hr 111 Genetic Screening: Declined Maternal Ultrasounds/Referrals: Normal Fetal Ultrasounds or other Referrals:  None Maternal Substance Abuse:  No Significant Maternal Medications:  None Significant Maternal Lab Results:  Group B Strep negative Number of Prenatal Visits:greater than 3 verified prenatal visits Maternal Vaccinations:TDap Other Comments:  None  Review of Systems  Constitutional:  Negative for chills and fever.  Respiratory:  Negative for shortness of breath.   Cardiovascular:  Negative for chest pain, palpitations and leg swelling.  Gastrointestinal:  Negative for abdominal pain and vomiting.  Neurological:  Negative for dizziness, weakness and headaches.  Psychiatric/Behavioral:  Negative for suicidal ideas.    Maternal Medical History:  Contractions: Onset was 3-5 hours ago.   Frequency: irregular.   Fetal activity: Perceived fetal activity is normal.   Prenatal complications: No bleeding.   Prenatal  Complications - Diabetes: none.     Height 5\' 5"  (1.651 m), weight 80.3 kg, unknown if currently breastfeeding. Exam Physical Exam Constitutional:      General: She is not in acute distress.    Appearance: She is well-developed.  HENT:     Head: Normocephalic and atraumatic.  Eyes:     Pupils: Pupils are equal, round, and reactive to light.  Cardiovascular:     Rate and Rhythm: Normal rate and regular rhythm.     Heart sounds: No murmur heard.    No gallop.  Abdominal:     Tenderness: There is no abdominal tenderness. There is no guarding or rebound.  Genitourinary:    Vagina: Normal.  Musculoskeletal:        General: Normal range of motion.     Cervical back: Normal range of motion and neck supple.  Skin:    General: Skin is warm and dry.  Neurological:     Mental Status: She is alert and oriented to person, place, and time.     Prenatal labs: ABO, Rh:   Antibody: Negative (09/30 0000) Rubella: Immune (09/30 0000) RPR: Nonreactive (09/30 0000)  HBsAg: Negative (09/30 0000)  HIV: Non-reactive (09/30 0000)  GBS: Negative/-- (04/02 0000)   Cat 1 tracing, TOCO q14min CE 3/50/-2  Assessment/Plan: This is a 27yo G3P2002 @ 39 4/7 by  LMP c/w 9wk scan admitted for elective IOL at term. GBS neg, CE 3/50/-2, pelvis proven to 7lb12oz. Attempted AROm, no fluid return, recheck in 2hrs. Begin pitocin  in interim, epidural when desired  Gaspar Karma 02/04/2024, 4:06 PM

## 2024-02-04 NOTE — Lactation Note (Signed)
 This note was copied from a baby's chart. Lactation Consultation Note  Patient Name: Beverly Dean [id number] Date: 02/04/2024 Age:29 hours Reason for consult: Initial assessment  P3- MOB's RN called this LC and informed me that MOB states she does not want to see the lactation team at all while in the hospital. LC completed MOB at this time.  Feeding Mother's Current Feeding Choice: Breast Milk  Consult Status Consult Status: Complete (mother declined follow up) Date: 02/04/24    [name] BS, IBCLC 02/04/2024, 10:44 PM

## 2024-02-04 NOTE — Anesthesia Preprocedure Evaluation (Signed)
 Anesthesia Evaluation  Patient identified by MRN, date of birth, ID band Patient awake    Reviewed: Allergy & Precautions, NPO status , Patient's Chart, lab work & pertinent test results  Airway Mallampati: II  TM Distance: >3 FB Neck ROM: Full    Dental  (+) Teeth Intact, Dental Advisory Given   Pulmonary neg pulmonary ROS   Pulmonary exam normal breath sounds clear to auscultation       Cardiovascular negative cardio ROS Normal cardiovascular exam Rhythm:Regular Rate:Normal     Neuro/Psych negative neurological ROS  negative psych ROS   GI/Hepatic Neg liver ROS,GERD  Medicated,,  Endo/Other  negative endocrine ROS    Renal/GU negative Renal ROS     Musculoskeletal negative musculoskeletal ROS (+)    Abdominal   Peds  Hematology  (+) Blood dyscrasia, anemia Plt 197k   Anesthesia Other Findings Day of surgery medications reviewed with the patient.  Reproductive/Obstetrics (+) Pregnancy                             Anesthesia Physical Anesthesia Plan  ASA: 2  Anesthesia Plan: Epidural   Post-op Pain Management:    Induction:   PONV Risk Score and Plan: 2 and Treatment may vary due to age or medical condition  Airway Management Planned: Natural Airway  Additional Equipment:   Intra-op Plan:   Post-operative Plan:   Informed Consent: I have reviewed the patients History and Physical, chart, labs and discussed the procedure including the risks, benefits and alternatives for the proposed anesthesia with the patient or authorized representative who has indicated his/her understanding and acceptance.     Dental advisory given  Plan Discussed with:   Anesthesia Plan Comments: (Patient identified. Risks/Benefits/Options discussed with patient including but not limited to bleeding, infection, nerve damage, paralysis, failed block, incomplete pain control, headache, blood pressure  changes, nausea, vomiting, reactions to medication both or allergic, itching and postpartum back pain. Confirmed with bedside nurse the patient's most recent platelet count. Confirmed with patient that they are not currently taking any anticoagulation, have any bleeding history or any family history of bleeding disorders. Patient expressed understanding and wished to proceed. All questions were answered. )       Anesthesia Quick Evaluation

## 2024-02-04 NOTE — Progress Notes (Signed)
 Labor Note  S: More cramping, considering not having epidural this time. Endorses large loss of fluid earlier while using restoom  O: BP 111/67   Pulse 88   Temp 98.3 F (36.8 C) (Oral)   Resp 18   Ht 5\' 5"  (1.651 m)   Wt 80.3 kg   BMI 29.47 kg/m  CE: tense forebag, clear AROM 1830 4/70/-2 FHR: Baseline 135, -accels, + early decels, mod variability TOCO q2-3, pitocin  at 43mU/min  A/P: This is a 28 y.o. [id number] at [redacted]w[redacted]d  admitted for elective IOL at term. GBS neg FWB: cat 1  MWB: overall comfortable Labor course: transitioning through latent labor, s/p AROM, continue to titrate pitocin   Anticipate SVD

## 2024-02-04 NOTE — Progress Notes (Signed)
 Pt on birthing ball

## 2024-02-04 NOTE — Anesthesia Procedure Notes (Signed)
 Epidural Patient location during procedure: OB Start time: 02/04/2024 8:05 PM End time: 02/04/2024 8:12 PM  Staffing Anesthesiologist: Erin Havers, MD Performed: anesthesiologist   Preanesthetic Checklist Completed: patient identified, IV checked, risks and benefits discussed, monitors and equipment checked, pre-op evaluation and timeout performed  Epidural Patient position: sitting Prep: DuraPrep Patient monitoring: blood pressure and continuous pulse ox Approach: midline Location: L3-L4 Injection technique: LOR air  Needle:  Needle type: Tuohy  Needle gauge: 17 G Needle length: 9 cm Needle insertion depth: 5 cm Catheter size: 19 Gauge Catheter at skin depth: 10 cm Test dose: negative and Other (1% Lidocaine )  Additional Notes Patient identified.  Risk benefits discussed including failed block, incomplete pain control, headache, nerve damage, paralysis, blood pressure changes, nausea, vomiting, reactions to medication both toxic or allergic, and postpartum back pain.  Patient expressed understanding and wished to proceed.  All questions were answered.  Sterile technique used throughout procedure and epidural site dressed with sterile barrier dressing. No paresthesia or other complications noted. The patient did not experience any signs of intravascular injection such as tinnitus or metallic taste in mouth nor signs of intrathecal spread such as rapid motor block. Please see nursing notes for vital signs. Reason for block:procedure for pain

## 2024-02-05 LAB — CBC
HCT: 33.1 % — ABNORMAL LOW (ref 36.0–46.0)
Hemoglobin: 10.5 g/dL — ABNORMAL LOW (ref 12.0–15.0)
MCH: 26.9 pg (ref 26.0–34.0)
MCHC: 31.7 g/dL (ref 30.0–36.0)
MCV: 84.9 fL (ref 80.0–100.0)
Platelets: 160 10*3/uL (ref 150–400)
RBC: 3.9 MIL/uL (ref 3.87–5.11)
RDW: 12.9 % (ref 11.5–15.5)
WBC: 13.6 10*3/uL — ABNORMAL HIGH (ref 4.0–10.5)
nRBC: 0 % (ref 0.0–0.2)

## 2024-02-05 LAB — RPR: RPR Ser Ql: NONREACTIVE

## 2024-02-05 NOTE — Progress Notes (Signed)
 Post Partum Day 1 Subjective: no complaints, up ad lib, voiding, tolerating PO, + flatus, and lochia mild. She denies CP, SOB, HA. She is bonding well with baby Joline Ned- breastfeeding. Has no complaints   Objective: Blood pressure 115/69, pulse 75, temperature 98.2 F (36.8 C), temperature source Oral, resp. rate 16, height 5\' 5"  (1.651 m), weight 80.3 kg, SpO2 100%, unknown if currently breastfeeding.  Physical Exam:  General: alert, cooperative, and no distress Lochia: appropriate Uterine Fundus: firm Incision: n/a DVT Evaluation: No evidence of DVT seen on physical exam. Negative Homan's sign.  Recent Labs    02/04/24 1629 02/05/24 0458  HGB 10.9* 10.5*  HCT 34.5* 33.1*    Assessment/Plan: Plan for discharge tomorrow, Breastfeeding, and Circumcision prior to discharge Pt understands that neonatal circumcision is not considered medically necessary and is elective. The risks include, but are not limited to bleeding, infection, damage to the penis, development of scar tissue, and having to have it redone at a later date. Pt understands theses risks and wishes to proceed   LOS: 1 day   Kairee Isa W Bayley Hurn, DO 02/05/2024, 10:36 AM

## 2024-02-05 NOTE — Anesthesia Postprocedure Evaluation (Cosign Needed)
 Anesthesia Post Note  Patient: Beverly Dean  Procedure(s) Performed: AN AD HOC LABOR EPIDURAL     Anesthesia Type: Epidural Level of consciousness: oriented, awake and alert and patient cooperative Pain management: pain level controlled Vital Signs Assessment: post-procedure vital signs reviewed and stable Postop Assessment: no headache, no backache, no apparent nausea or vomiting, able to ambulate and patient able to bend at knees Anesthetic complications: no   No notable events documented.  Last Vitals:  Vitals:   02/05/24 0334 02/05/24 0712  BP: 96/64 115/69  Pulse: 75 75  Resp: 18 16  Temp: 36.9 C 36.8 C  SpO2: 100% 100%    Last Pain:  Vitals:   02/05/24 0726  TempSrc:   PainSc: 6    Pain Goal: Patients Stated Pain Goal: 7 (02/04/24 1607)                 Glo Larch

## 2024-02-06 LAB — BIRTH TISSUE RECOVERY COLLECTION (PLACENTA DONATION)

## 2024-02-06 MED ORDER — ACETAMINOPHEN 325 MG PO TABS: 650.0000 mg | ORAL_TABLET | ORAL | Status: AC | PRN

## 2024-02-06 MED ORDER — IBUPROFEN 600 MG PO TABS: 600.0000 mg | ORAL_TABLET | Freq: Four times a day (QID) | ORAL | Status: AC | PRN

## 2024-02-06 NOTE — Discharge Summary (Signed)
 Postpartum Discharge Summary  Date of Service updated 4/27-4/29/25     Patient Name: Beverly Dean DOB: Jan 31, 1996 MRN: [id number]  Date of admission: 02/04/2024 Delivery date:02/04/2024 Delivering provider: Gaspar Karma Date of discharge: 02/06/2024  Admitting diagnosis: [redacted] weeks gestation of pregnancy [Z3A.39] Intrauterine pregnancy: [redacted]w[redacted]d     Secondary diagnosis:  Principal Problem:   [redacted] weeks gestation of pregnancy  Additional problems: none   Discharge diagnosis: Term Pregnancy Delivered                                              Post partum procedures: none Augmentation: AROM and Pitocin  Complications: None  Hospital course: Induction of Labor With Vaginal Delivery   28 y.o. yo G3P3003 at [redacted]w[redacted]d was admitted to the hospital 02/04/2024 for induction of labor.  Indication for induction: Elective.  Patient had an labor course complicated by nothing Membrane Rupture Time/Date: 4:17 PM,02/04/2024  Delivery Method:Vaginal, Spontaneous Episiotomy: None Lacerations:  None Details of delivery can be found in separate delivery note.  Patient had a postpartum course complicated by nothing. Patient is discharged home 02/06/24.  Newborn Data: Birth date:02/04/2024 Birth time:8:49 PM Gender:Female Living status:Living Apgars:9 ,9  Weight:3640 g  Magnesium Sulfate received: No BMZ received: No Rhophylac:No T-DaP:Given prenatally RSV Vaccine received: No Transfusion:No Immunizations administered: There is no immunization history for the selected administration types on file for this patient.  Physical exam  Vitals:   02/05/24 1119 02/05/24 1344 02/05/24 2029 02/06/24 0525  BP: 102/63 109/60 114/70 108/74  Pulse: 82 76 88 76  Resp: 16 18 19 18   Temp: 97.8 F (36.6 C) 98.1 F (36.7 C) 98.6 F (37 C) 97.6 F (36.4 C)  TempSrc: Axillary Axillary Axillary Oral  SpO2: 99% 99% 99% 99%  Weight:      Height:       General: alert, cooperative, and no  distress Lochia: appropriate Uterine Fundus: firm DVT Evaluation: No evidence of DVT seen on physical exam. Labs: Lab Results  Component Value Date   WBC 13.6 (H) 02/05/2024   HGB 10.5 (L) 02/05/2024   HCT 33.1 (L) 02/05/2024   MCV 84.9 02/05/2024   PLT 160 02/05/2024      Latest Ref Rng & Units 11/10/2022    1:14 PM  CMP  Glucose 70 - 99 mg/dL 99   BUN 6 - 20 mg/dL 29   Creatinine 6.44 - 1.00 mg/dL 0.34   Sodium 742 - 595 mmol/L 134   Potassium 3.5 - 5.1 mmol/L 4.0   Chloride 98 - 111 mmol/L 99   CO2 22 - 32 mmol/L 27   Calcium 8.9 - 10.3 mg/dL 8.6    Edinburgh Score:    02/05/2024    7:16 AM  Edinburgh Postnatal Depression Scale Screening Tool  I have been able to laugh and see the funny side of things. 0  I have looked forward with enjoyment to things. 0  I have blamed myself unnecessarily when things went wrong. 0  I have been anxious or worried for no good reason. 0  I have felt scared or panicky for no good reason. 0  Things have been getting on top of me. 1  I have been so unhappy that I have had difficulty sleeping. 0  I have felt sad or miserable. 0  I have been so unhappy that I have been  crying. 0  The thought of harming myself has occurred to me. 0  Edinburgh Postnatal Depression Scale Total 1      After visit meds:  Allergies as of 02/06/2024   No Known Allergies      Medication List     STOP taking these medications    cyclobenzaprine  10 MG tablet Commonly known as: FLEXERIL        TAKE these medications    acetaminophen  325 MG tablet Commonly known as: Tylenol  Take 2 tablets (650 mg total) by mouth every 4 (four) hours as needed (for pain scale < 4).   famotidine 20 MG tablet Commonly known as: PEPCID Take 20 mg by mouth 2 (two) times daily.   ibuprofen  600 MG tablet Commonly known as: ADVIL  Take 1 tablet (600 mg total) by mouth every 6 (six) hours as needed for moderate pain (pain score 4-6). What changed: reasons to take this    multivitamin-prenatal 27-0.8 MG Tabs tablet Take 1 tablet by mouth daily.         Discharge home in stable condition Infant Feeding: Bottle and Breast Infant Disposition:home with mother Discharge instruction: per After Visit Summary and Postpartum booklet. Activity: Advance as tolerated. Pelvic rest for 6 weeks.  Diet: routine diet Anticipated Birth Control: Unsure Postpartum Appointment:6 weeks Additional Postpartum F/U:  Routine Future Appointments:No future appointments. Follow up Visit:  Follow-up Information     [facility]. Schedule an appointment as soon as possible for a visit in 6 week(s).   Contact information: [address]  [address] [place] [address] [phone]                     02/06/2024 Alene Ana, MD

## 2024-02-06 NOTE — Progress Notes (Signed)
 Post Partum Day 2 Subjective: no complaints, up ad lib, voiding, tolerating PO, + flatus, and lochia mild. She denies CP, SOB, HA. She is bonding well with baby Joline Ned- breastfeeding. Has no complaints   Objective: Blood pressure 108/74, pulse 76, temperature 97.6 F (36.4 C), temperature source Oral, resp. rate 18, height 5\' 5"  (1.651 m), weight 80.3 kg, SpO2 99%, unknown if currently breastfeeding.  Physical Exam:  General: alert, cooperative, and no distress Lochia: appropriate Uterine Fundus: firm DVT Evaluation: No evidence of DVT seen on physical exam.  Recent Labs    02/04/24 1629 02/05/24 0458  HGB 10.9* 10.5*  HCT 34.5* 33.1*    Assessment/Plan: Plan for discharge today, Breastfeeding, and Circumcision prior to discharge Pt understands that neonatal circumcision is not considered medically necessary and is elective. The risks include, but are not limited to bleeding, infection, damage to the penis, development of scar tissue, and having to have it redone at a later date. Pt understands theses risks and wishes to proceed   LOS: 2 days   Alene Ana, MD 02/06/2024, 7:11 AM

## 2024-02-06 NOTE — Plan of Care (Signed)
   Problem: Education: Goal: Knowledge of General Education information will improve Description: Including pain rating scale, medication(s)/side effects and non-pharmacologic comfort measures Outcome: Completed/Met

## 2024-02-14 ENCOUNTER — Inpatient Hospital Stay (HOSPITAL_COMMUNITY): Payer: Managed Care, Other (non HMO)

## 2024-02-17 ENCOUNTER — Telehealth (HOSPITAL_COMMUNITY): Payer: Self-pay

## 2024-02-17 NOTE — Telephone Encounter (Signed)
 02/17/2024 1349  Name: Beverly Dean MRN: [id number] DOB: April 06, 1996  Reason for Call:  Transition of Care Hospital Discharge Call  Contact Status: Patient Contact Status: Complete  Language assistant needed:          Follow-Up Questions: Do You Have Any Concerns About Your Health As You Heal From Delivery?: No Do You Have Any Concerns About Your Infants Health?: No  Edinburgh Postnatal Depression Scale:  In the Past 7 Days: I have been able to laugh and see the funny side of things.: As much as I always could I have looked forward with enjoyment to things.: As much as I ever did I have blamed myself unnecessarily when things went wrong.: No, never I have been anxious or worried for no good reason.: Hardly ever I have felt scared or panicky for no good reason.: No, not at all Things have been getting on top of me.: No, I have been coping as well as ever I have been so unhappy that I have had difficulty sleeping.: Not at all I have felt sad or miserable.: No, not at all I have been so unhappy that I have been crying.: No, never The thought of harming myself has occurred to me.: Never Edinburgh Postnatal Depression Scale Total: 1  PHQ2-9 Depression Scale:     Discharge Follow-up: Edinburgh score requires follow up?: No Patient was advised of the following resources:: Breastfeeding Support Group, Support Group  Post-discharge interventions: Reviewed Newborn Safe Sleep Practices  Signature  [employer] Guild
